# Patient Record
Sex: Female | Born: 1960 | Race: White | Hispanic: No | Marital: Single | Smoking: Never smoker
Health system: Southern US, Community
[De-identification: ages and names within clinical notes are randomized; demographics above are authoritative.]

## PROBLEM LIST (undated history)

## (undated) DIAGNOSIS — Z800 Family history of malignant neoplasm of digestive organs: Secondary | ICD-10-CM

## (undated) DIAGNOSIS — M1990 Unspecified osteoarthritis, unspecified site: Secondary | ICD-10-CM

## (undated) DIAGNOSIS — T7840XA Allergy, unspecified, initial encounter: Secondary | ICD-10-CM

## (undated) DIAGNOSIS — Z8042 Family history of malignant neoplasm of prostate: Secondary | ICD-10-CM

## (undated) DIAGNOSIS — K5900 Constipation, unspecified: Secondary | ICD-10-CM

## (undated) DIAGNOSIS — Z803 Family history of malignant neoplasm of breast: Secondary | ICD-10-CM

## (undated) HISTORY — DX: Family history of malignant neoplasm of digestive organs: Z80.0

## (undated) HISTORY — DX: Family history of malignant neoplasm of prostate: Z80.42

## (undated) HISTORY — PX: COLONOSCOPY: SHX174

## (undated) HISTORY — DX: Unspecified osteoarthritis, unspecified site: M19.90

## (undated) HISTORY — DX: Constipation, unspecified: K59.00

## (undated) HISTORY — DX: Allergy, unspecified, initial encounter: T78.40XA

## (undated) HISTORY — PX: TOTAL HIP ARTHROPLASTY: SHX124

## (undated) HISTORY — PX: POLYPECTOMY: SHX149

## (undated) HISTORY — PX: BILATERAL ANTERIOR TOTAL HIP ARTHROPLASTY: SHX5567

## (undated) HISTORY — PX: JOINT REPLACEMENT: SHX530

## (undated) HISTORY — DX: Family history of malignant neoplasm of breast: Z80.3

## (undated) HISTORY — PX: OTHER SURGICAL HISTORY: SHX169

---

## 1998-03-16 ENCOUNTER — Inpatient Hospital Stay (HOSPITAL_COMMUNITY): Admission: AD | Admit: 1998-03-16 | Discharge: 1998-03-18 | Payer: Self-pay | Admitting: Obstetrics and Gynecology

## 1998-03-20 ENCOUNTER — Encounter (HOSPITAL_COMMUNITY): Admission: RE | Admit: 1998-03-20 | Discharge: 1998-06-18 | Payer: Self-pay | Admitting: Obstetrics and Gynecology

## 1998-05-12 ENCOUNTER — Other Ambulatory Visit: Admission: RE | Admit: 1998-05-12 | Discharge: 1998-05-12 | Payer: Self-pay | Admitting: Obstetrics and Gynecology

## 1999-05-13 ENCOUNTER — Other Ambulatory Visit: Admission: RE | Admit: 1999-05-13 | Discharge: 1999-05-13 | Payer: Self-pay | Admitting: Obstetrics and Gynecology

## 2004-09-17 ENCOUNTER — Ambulatory Visit: Payer: Self-pay | Admitting: Internal Medicine

## 2004-09-23 ENCOUNTER — Ambulatory Visit: Payer: Self-pay | Admitting: Family Medicine

## 2004-09-23 ENCOUNTER — Other Ambulatory Visit: Admission: RE | Admit: 2004-09-23 | Discharge: 2004-09-23 | Payer: Self-pay | Admitting: Family Medicine

## 2005-01-18 ENCOUNTER — Ambulatory Visit: Payer: Self-pay | Admitting: Family Medicine

## 2005-07-28 ENCOUNTER — Ambulatory Visit: Payer: Self-pay | Admitting: Internal Medicine

## 2005-09-29 ENCOUNTER — Ambulatory Visit: Payer: Self-pay | Admitting: Internal Medicine

## 2005-10-06 ENCOUNTER — Encounter: Payer: Self-pay | Admitting: Family Medicine

## 2005-10-06 ENCOUNTER — Ambulatory Visit: Payer: Self-pay | Admitting: Family Medicine

## 2005-10-06 ENCOUNTER — Other Ambulatory Visit: Admission: RE | Admit: 2005-10-06 | Discharge: 2005-10-06 | Payer: Self-pay | Admitting: Family Medicine

## 2005-12-01 ENCOUNTER — Ambulatory Visit: Payer: Self-pay | Admitting: Internal Medicine

## 2005-12-02 ENCOUNTER — Ambulatory Visit: Payer: Self-pay | Admitting: Internal Medicine

## 2006-10-31 ENCOUNTER — Encounter: Payer: Self-pay | Admitting: Family Medicine

## 2006-10-31 ENCOUNTER — Ambulatory Visit: Payer: Self-pay | Admitting: Family Medicine

## 2006-10-31 DIAGNOSIS — J309 Allergic rhinitis, unspecified: Secondary | ICD-10-CM | POA: Insufficient documentation

## 2006-10-31 LAB — CONVERTED CEMR LAB
Blood in Urine, dipstick: NEGATIVE
Glucose, Urine, Semiquant: NEGATIVE
Ketones, urine, test strip: NEGATIVE
Nitrite: POSITIVE
Protein, U semiquant: NEGATIVE
pH: 6

## 2006-11-01 ENCOUNTER — Encounter: Payer: Self-pay | Admitting: Family Medicine

## 2007-09-21 ENCOUNTER — Ambulatory Visit: Payer: Self-pay | Admitting: Internal Medicine

## 2007-11-16 ENCOUNTER — Encounter: Payer: Self-pay | Admitting: Internal Medicine

## 2008-04-11 ENCOUNTER — Encounter (INDEPENDENT_AMBULATORY_CARE_PROVIDER_SITE_OTHER): Payer: Self-pay | Admitting: *Deleted

## 2008-04-11 ENCOUNTER — Ambulatory Visit: Payer: Self-pay | Admitting: Family Medicine

## 2008-04-11 DIAGNOSIS — R141 Gas pain: Secondary | ICD-10-CM | POA: Insufficient documentation

## 2008-04-11 DIAGNOSIS — R142 Eructation: Secondary | ICD-10-CM

## 2008-04-11 DIAGNOSIS — R143 Flatulence: Secondary | ICD-10-CM

## 2008-04-11 LAB — CONVERTED CEMR LAB
Bilirubin Urine: NEGATIVE
Glucose, Urine, Semiquant: NEGATIVE
Ketones, urine, test strip: NEGATIVE
Protein, U semiquant: NEGATIVE
Specific Gravity, Urine: <1.005
Urobilinogen, UA: 0.2

## 2008-04-12 ENCOUNTER — Encounter: Payer: Self-pay | Admitting: Family Medicine

## 2008-04-17 ENCOUNTER — Encounter (INDEPENDENT_AMBULATORY_CARE_PROVIDER_SITE_OTHER): Payer: Self-pay | Admitting: *Deleted

## 2008-04-18 ENCOUNTER — Encounter: Admission: RE | Admit: 2008-04-18 | Discharge: 2008-04-18 | Payer: Self-pay | Admitting: Family Medicine

## 2008-04-18 ENCOUNTER — Encounter (INDEPENDENT_AMBULATORY_CARE_PROVIDER_SITE_OTHER): Payer: Self-pay | Admitting: *Deleted

## 2008-04-18 DIAGNOSIS — D259 Leiomyoma of uterus, unspecified: Secondary | ICD-10-CM | POA: Insufficient documentation

## 2008-04-22 ENCOUNTER — Telehealth (INDEPENDENT_AMBULATORY_CARE_PROVIDER_SITE_OTHER): Payer: Self-pay | Admitting: *Deleted

## 2008-04-23 ENCOUNTER — Encounter (INDEPENDENT_AMBULATORY_CARE_PROVIDER_SITE_OTHER): Payer: Self-pay | Admitting: *Deleted

## 2008-04-30 ENCOUNTER — Encounter: Payer: Self-pay | Admitting: Family Medicine

## 2008-06-11 ENCOUNTER — Ambulatory Visit: Payer: Self-pay | Admitting: Family Medicine

## 2008-10-01 ENCOUNTER — Ambulatory Visit: Payer: Self-pay | Admitting: Internal Medicine

## 2008-11-17 ENCOUNTER — Ambulatory Visit: Payer: Self-pay | Admitting: Family Medicine

## 2008-11-17 DIAGNOSIS — M549 Dorsalgia, unspecified: Secondary | ICD-10-CM | POA: Insufficient documentation

## 2009-03-20 ENCOUNTER — Ambulatory Visit: Payer: Self-pay | Admitting: Internal Medicine

## 2009-03-20 DIAGNOSIS — N920 Excessive and frequent menstruation with regular cycle: Secondary | ICD-10-CM | POA: Insufficient documentation

## 2009-03-20 DIAGNOSIS — R1084 Generalized abdominal pain: Secondary | ICD-10-CM | POA: Insufficient documentation

## 2009-03-21 LAB — CONVERTED CEMR LAB
Amylase: 92 units/L (ref 27–131)
Basophils Relative: 0 % (ref 0.0–3.0)
Eosinophils Relative: 0.9 % (ref 0.0–5.0)
HCT: 43.2 % (ref 36.0–46.0)
Lipase: 19 units/L (ref 11.0–59.0)
Lymphs Abs: 2.7 10*3/uL (ref 0.7–4.0)
MCV: 93.1 fL (ref 78.0–100.0)
Monocytes Absolute: 0.5 10*3/uL (ref 0.1–1.0)
Monocytes Relative: 7.2 % (ref 3.0–12.0)
Neutrophils Relative %: 52.8 % (ref 43.0–77.0)
Platelets: 241 10*3/uL (ref 150.0–400.0)
RBC: 4.64 M/uL (ref 3.87–5.11)
WBC: 6.8 10*3/uL (ref 4.5–10.5)

## 2009-03-23 ENCOUNTER — Encounter (INDEPENDENT_AMBULATORY_CARE_PROVIDER_SITE_OTHER): Payer: Self-pay | Admitting: *Deleted

## 2009-03-26 ENCOUNTER — Telehealth (INDEPENDENT_AMBULATORY_CARE_PROVIDER_SITE_OTHER): Payer: Self-pay | Admitting: *Deleted

## 2009-04-28 ENCOUNTER — Ambulatory Visit: Payer: Self-pay | Admitting: Internal Medicine

## 2009-04-28 DIAGNOSIS — J019 Acute sinusitis, unspecified: Secondary | ICD-10-CM | POA: Insufficient documentation

## 2009-06-18 ENCOUNTER — Ambulatory Visit: Payer: Self-pay | Admitting: Family Medicine

## 2009-06-18 DIAGNOSIS — M25519 Pain in unspecified shoulder: Secondary | ICD-10-CM | POA: Insufficient documentation

## 2009-06-18 DIAGNOSIS — M79609 Pain in unspecified limb: Secondary | ICD-10-CM | POA: Insufficient documentation

## 2009-06-19 ENCOUNTER — Encounter: Payer: Self-pay | Admitting: Family Medicine

## 2009-06-19 LAB — CONVERTED CEMR LAB
Chloride: 102 meq/L (ref 96–112)
GFR calc non Af Amer: 70.99 mL/min (ref >60)
Potassium: 4.1 meq/L (ref 3.5–5.1)
Sodium: 141 meq/L (ref 135–145)

## 2009-08-12 ENCOUNTER — Telehealth (INDEPENDENT_AMBULATORY_CARE_PROVIDER_SITE_OTHER): Payer: Self-pay | Admitting: *Deleted

## 2009-08-21 ENCOUNTER — Ambulatory Visit: Payer: Self-pay | Admitting: Family Medicine

## 2009-08-21 ENCOUNTER — Telehealth (INDEPENDENT_AMBULATORY_CARE_PROVIDER_SITE_OTHER): Payer: Self-pay | Admitting: *Deleted

## 2009-09-07 ENCOUNTER — Telehealth (INDEPENDENT_AMBULATORY_CARE_PROVIDER_SITE_OTHER): Payer: Self-pay | Admitting: *Deleted

## 2009-09-08 ENCOUNTER — Ambulatory Visit: Payer: Self-pay | Admitting: Family Medicine

## 2009-09-08 DIAGNOSIS — J069 Acute upper respiratory infection, unspecified: Secondary | ICD-10-CM | POA: Insufficient documentation

## 2009-09-08 LAB — CONVERTED CEMR LAB: Inflenza A Ag: NEGATIVE

## 2009-09-10 ENCOUNTER — Encounter (INDEPENDENT_AMBULATORY_CARE_PROVIDER_SITE_OTHER): Payer: Self-pay | Admitting: *Deleted

## 2009-09-10 ENCOUNTER — Telehealth: Payer: Self-pay | Admitting: Family Medicine

## 2009-09-14 ENCOUNTER — Telehealth: Payer: Self-pay | Admitting: Family Medicine

## 2009-09-16 ENCOUNTER — Ambulatory Visit: Payer: Self-pay | Admitting: Family Medicine

## 2009-09-16 LAB — CONVERTED CEMR LAB
Bilirubin Urine: NEGATIVE
Blood in Urine, dipstick: NEGATIVE
Glucose, Urine, Semiquant: NEGATIVE
Ketones, urine, test strip: NEGATIVE
Specific Gravity, Urine: <1.005
pH: 5

## 2010-06-01 ENCOUNTER — Encounter: Payer: Self-pay | Admitting: Family Medicine

## 2010-06-30 ENCOUNTER — Ambulatory Visit: Payer: Self-pay | Admitting: Family

## 2010-06-30 DIAGNOSIS — N760 Acute vaginitis: Secondary | ICD-10-CM | POA: Insufficient documentation

## 2010-06-30 LAB — CONVERTED CEMR LAB
Beta hcg, urine, semiquantitative: NEGATIVE
Bilirubin Urine: NEGATIVE
Glucose, Urine, Semiquant: NEGATIVE
Ketones, urine, test strip: NEGATIVE
Specific Gravity, Urine: <1.005
Urobilinogen, UA: 0.2
pH: 7

## 2010-07-01 ENCOUNTER — Encounter: Payer: Self-pay | Admitting: Family Medicine

## 2010-07-02 ENCOUNTER — Encounter: Payer: Self-pay | Admitting: Family Medicine

## 2010-07-06 ENCOUNTER — Encounter: Payer: Self-pay | Admitting: Family Medicine

## 2010-07-06 ENCOUNTER — Telehealth (INDEPENDENT_AMBULATORY_CARE_PROVIDER_SITE_OTHER): Payer: Self-pay | Admitting: *Deleted

## 2010-07-06 LAB — CONVERTED CEMR LAB: Chlamydia, DNA Probe: NEGATIVE

## 2010-07-08 ENCOUNTER — Telehealth (INDEPENDENT_AMBULATORY_CARE_PROVIDER_SITE_OTHER): Payer: Self-pay | Admitting: *Deleted

## 2010-07-21 ENCOUNTER — Encounter: Payer: Self-pay | Admitting: Family Medicine

## 2010-08-03 ENCOUNTER — Encounter: Payer: Self-pay | Admitting: Internal Medicine

## 2010-08-23 ENCOUNTER — Encounter: Payer: Self-pay | Admitting: Family Medicine

## 2010-09-02 NOTE — Progress Notes (Signed)
Summary: ? flu  Phone Note Call from Patient Call back at Home Phone [phone]   Caller: Patient Summary of Call: pt states that she think that she has the flu. pt daughter dx with flu and they are both at home. pt states that she will need a work note. pt states that she has had a fever of 102 and her daughter 66. pt states that she has been alternating motrin and tylenol every 4 hours and wants to know if there is anything else she needs to do. Advise pt to push fluid, increase vitamin c, and zicam. pt coming in for OV tomorrow is there anything else she needs to try pls advise...........................Marland KitchenFelecia Deloach CMA  September 07, 2009 3:06 PM     Follow-up for Phone Call        When did pt symptoms start---if within 48 hours---can take tamiflu 75 mg two times a day for 5 days  Follow-up by: Loreen Freud DO,  September 07, 2009 3:51 PM  Additional Follow-up for Phone Call Additional follow up Details #1::        PT COMING IN TODAY for OV ................Marland KitchenFelecia Deloach CMA  September 08, 2009 9:15 AM

## 2010-09-02 NOTE — Progress Notes (Signed)
Summary: Work note? (lmom 1/12)  Phone Note Call from Patient Call back at [facility] Phone [phone]   Summary of Call: Pt called and stated that she has very painful cramps today, a headache, heavy flow. She states in order to get paid for today she has to have a dr note for our office. Is this something that you will do. She knows normally it does not work if she has not seen you the day of. Please advise. Army Fossa CMA  August 12, 2009 11:56 AM  Follow-up for Phone Call        I can't write a note unless I've seen her. Follow-up by: Loreen Freud DO,  August 12, 2009 1:14 PM  Additional Follow-up for Phone Call Additional follow up Details #1::        left message to call back Army Fossa CMA  August 12, 2009 1:33 PM

## 2010-09-02 NOTE — Progress Notes (Signed)
Summary: still no better  Phone Note Call from Patient Call back at Work Phone [phone]   Caller: Patient Summary of Call: pt seen on  09-08-09 and  is still no better. Pt c/o fatigue, aches, drainage in throat, ear pressure, and green mucous. pt  denies any fever................Marland KitchenFelecia Deloach CMA  September 14, 2009 11:58 AM   Follow-up for Phone Call        augmentin 875 1 by mouth two times a day for 10 days ---ov if no better  Follow-up by: Loreen Freud DO,  September 14, 2009 12:10 PM  Additional Follow-up for Phone Call Additional follow up Details #1::        pt aware rx sent to pharmacy............Marland KitchenFelecia Deloach CMA  September 14, 2009 1:16 PM     New/Updated Medications: AUGMENTIN 875-125 MG TABS (AMOXICILLIN-POT CLAVULANATE) take 1 by mouth two times a day for 10 days Prescriptions: AUGMENTIN 875-125 MG TABS (AMOXICILLIN-POT CLAVULANATE) take 1 by mouth two times a day for 10 days  #20 x 0   Entered by:   Jeremy Johann CMA   Authorized by:   Loreen Freud DO   Signed by:   Jeremy Johann CMA on 09/14/2009   Method used:   Faxed to ...       [facility] [address] [phone]* (retail)       [address]       [place], [place]  [address]       Ph: [phone]       Fax: [phone]   RxID:   [id number]

## 2010-09-02 NOTE — Progress Notes (Signed)
Summary: Unable to contact patient  Phone Note Outgoing Call   Call placed by: Almeta Monas CMA Duncan Dull),  July 06, 2010 10:45 AM Call placed to: Patient Summary of Call: Tried calling the patient at [phone] and this number is disconnected and tried [phone] and this is a voicemail for someone else. Letter mailed to have patient contact the office..... Almeta Monas CMA Duncan Dull)  July 06, 2010 10:47 AM

## 2010-09-02 NOTE — Assessment & Plan Note (Signed)
Summary: sinus infection/drb   Vital Signs:  Patient profile:   50 year old female Weight:      165.13 pounds Temp:     97.9 degrees F oral Pulse rate:   82 / minute Pulse rhythm:   regular BP sitting:   122 / 80  (left arm) Cuff size:   large  Vitals Entered By: Army Fossa CMA (August 21, 2009 9:56 AM) CC: URI symptoms   History of Present Illness:       This is a 50 year old woman who presents with URI symptoms.  The symptoms began 1 week ago.  The patient complains of nasal congestion, purulent nasal discharge, productive cough, and earache.  The patient denies fever, low-grade fever (<100.5 degrees), fever of 100.5-103 degrees, fever of 103.1-104 degrees, fever to >104 degrees, stiff neck, dyspnea, wheezing, rash, vomiting, diarrhea, use of an antipyretic, and response to antipyretic.  The patient also reports headache.  The patient denies the following risk factors for Strep sinusitis: unilateral facial pain, unilateral nasal discharge, poor response to decongestant, double sickening, tooth pain, Strep exposure, tender adenopathy, and absence of cough.  Pt using neti pot with little relief.    Current Medications (verified): 1)  Nasonex 50 Mcg/act Susp (Mometasone Furoate) .... 2 Sprays Each Nostril Daily  Allergies (verified): No Known Drug Allergies  Past History:  Past Medical History: Last updated: 10/31/2006 Allergic rhinitis  Social History: Last updated: 06/18/2009 Married-- separated  Review of Systems      See HPI  Physical Exam  General:  Well-developed,well-nourished,in no acute distress; alert,appropriate and cooperative throughout examination Ears:  External ear exam shows no significant lesions or deformities.  Otoscopic examination reveals clear canals, tympanic membranes are intact bilaterally without bulging, retraction, inflammation or discharge. Hearing is grossly normal bilaterally. Nose:  L frontal sinus tenderness, L maxillary sinus  tenderness, R frontal sinus tenderness, and R maxillary sinus tenderness.   Mouth:  Oral mucosa and oropharynx without lesions or exudates.  Teeth in good repair. Neck:  No deformities, masses, or tenderness noted. Lungs:  Normal respiratory effort, chest expands symmetrically. Lungs are clear to auscultation, no crackles or wheezes. Heart:  Normal rate and regular rhythm. S1 and S2 normal without gallop, murmur, click, rub or other extra sounds. Skin:  Intact without suspicious lesions or rashes Psych:  Cognition and judgment appear intact. Alert and cooperative with normal attention span and concentration. No apparent delusions, illusions, hallucinations   Impression & Recommendations:  Problem # 1:  SINUSITIS - ACUTE-NOS (ICD-461.9)  Her updated medication list for this problem includes:    Nasonex 50 Mcg/act Susp (Mometasone furoate) .Marland Kitchen... 2 sprays each nostril daily    Biaxin Xl Pac 500 Mg Xr24h-tab (Clarithromycin) .Marland Kitchen... 2 by mouth once daily  Instructed on treatment. Call if symptoms persist or worsen.   Complete Medication List: 1)  Nasonex 50 Mcg/act Susp (Mometasone furoate) .... 2 sprays each nostril daily 2)  Biaxin Xl Pac 500 Mg Xr24h-tab (Clarithromycin) .... 2 by mouth once daily 3)  Fluconazole 150 Mg Tabs (Fluconazole) .Marland Kitchen.. 1 by mouth once daily x1--may repeat in 3 days as needed Prescriptions: FLUCONAZOLE 150 MG TABS (FLUCONAZOLE) 1 by mouth once daily x1--may repeat in 3 days as needed  #2 x 3   Entered and Authorized by:   Loreen Freud DO   Signed by:   Loreen Freud DO on 08/21/2009   Method used:   Electronically to        [facility] [address] [phone]* (retail)       [address]       [place], [place]  [address]       Ph: [phone]       Fax: [phone]   RxID:   [id number] NASONEX 50 MCG/ACT SUSP (MOMETASONE FUROATE) 2 sprays each nostril daily  #1 x 11   Entered and Authorized by:   Loreen Freud DO   Signed by:   Loreen Freud DO on 08/21/2009   Method used:   Electronically to        [facility] [phone]* (retail)       [address]       [place], [place]  [address]       Ph: [phone]       Fax: [phone]   RxID:   [id number] BIAXIN XL PAC 500 MG XR24H-TAB (CLARITHROMYCIN) 2 by mouth once daily  #28 x 0   Entered and Authorized by:   Loreen Freud DO   Signed by:   Loreen Freud DO on 08/21/2009   Method used:   Electronically to        [facility] [phone]* (retail)       [address]       [place], [place]  [address]       Ph: [phone]       Fax: [phone]   RxID:   [id number]

## 2010-09-02 NOTE — Progress Notes (Signed)
Summary: Phone  Phone Note Call from Patient Call back at Home Phone [phone]   Caller: Patient Summary of Call: Patient is at [facility] [address] and she is waiting. Patient states they dont have  biaxin in the package  that Dr. Laury Axon prescribed.  Please advise Initial call taken by: Barb Merino,  August 21, 2009 11:35 AM  Follow-up for Phone Call        Per dr Laury Axon it is okay to do the pills. Army Fossa CMA  August 21, 2009 11:47 AM

## 2010-09-02 NOTE — Letter (Signed)
Summary: Meds & Instructions/[facility]  Meds & Instructions/[facility]   Imported By: Lanelle Bal 08/05/2010 08:06:37  _____________________________________________________________________  External Attachment:    Type:   Image     Comment:   External Document

## 2010-09-02 NOTE — Assessment & Plan Note (Signed)
Summary: ? flu,fever/fd   Vital Signs:  Patient profile:   50 year old female Temp:     98.3 degrees F oral Pulse rate:   64 / minute Pulse rhythm:   regular BP sitting:   150 / 78  (left arm) Cuff size:   large  Vitals Entered By: Army Fossa CMA (September 08, 2009 2:14 PM) CC: Chills, cough, nasal congestion, body aches, ear aches. , URI symptoms   History of Present Illness:       This is a 50 year old woman who presents with URI symptoms.  The symptoms began 4 days ago.  Pt has been taking tylenol and ibuprofen for fever etc.  The patient complains of nasal congestion, purulent nasal discharge, and sore throat, but denies dry cough and productive cough.  Associated symptoms include fever of 100.5-103 degrees.  The patient denies fever, low-grade fever (<100.5 degrees), fever of 103.1-104 degrees, fever to >104 degrees, stiff neck, dyspnea, wheezing, rash, vomiting, diarrhea, use of an antipyretic, and response to antipyretic.  The patient denies itchy watery eyes, itchy throat, sneezing, seasonal symptoms, response to antihistamine, headache, muscle aches, and severe fatigue.  The patient denies the following risk factors for Strep sinusitis: unilateral facial pain, unilateral nasal discharge, poor response to decongestant, double sickening, tooth pain, Strep exposure, tender adenopathy, and absence of cough.    Current Medications (verified): 1)  Nasonex 50 Mcg/act Susp (Mometasone Furoate) .... 2 Sprays Each Nostril Daily 2)  Fluconazole 150 Mg Tabs (Fluconazole) .Marland Kitchen.. 1 By Mouth Once Daily X1--May Repeat in 3 Days As Needed 3)  Tamiflu 75 Mg Caps (Oseltamivir Phosphate) .Marland Kitchen.. 1 By Mouth Two Times A Day  Allergies (verified): No Known Drug Allergies  Past History:  Past medical, surgical, family and social histories (including risk factors) reviewed for relevance to current acute and chronic problems.  Past Medical History: Reviewed history from 10/31/2006 and no changes  required. Allergic rhinitis  Family History: Reviewed history and no changes required.  Social History: Reviewed history from 06/18/2009 and no changes required. Married-- separated  Review of Systems      See HPI  Physical Exam  General:  Well-developed,well-nourished,in no acute distress; alert,appropriate and cooperative throughout examination Ears:  External ear exam shows no significant lesions or deformities.  Otoscopic examination reveals clear canals, tympanic membranes are intact bilaterally without bulging, retraction, inflammation or discharge. Hearing is grossly normal bilaterally. Neck:  No deformities, masses, or tenderness noted. Lungs:  Normal respiratory effort, chest expands symmetrically. Lungs are clear to auscultation, no crackles or wheezes. Heart:  Normal rate and regular rhythm. S1 and S2 normal without gallop, murmur, click, rub or other extra sounds. Extremities:  No clubbing, cyanosis, edema, or deformity noted with normal full range of motion of all joints.   Skin:  Intact without suspicious lesions or rashes Cervical Nodes:  No lymphadenopathy noted Psych:  Cognition and judgment appear intact. Alert and cooperative with normal attention span and concentration. No apparent delusions, illusions, hallucinations   Impression & Recommendations:  Problem # 1:  URI (ICD-465.9)  ?flu----tamiflu con't nasonex claritin or zyrtec  Orders: Flu A+B (956[address])  Complete Medication List: 1)  Nasonex 50 Mcg/act Susp (Mometasone furoate) .... 2 sprays each nostril daily 2)  Fluconazole 150 Mg Tabs (Fluconazole) .Marland Kitchen.. 1 by mouth once daily x1--may repeat in 3 days as needed 3)  Tamiflu 75 Mg Caps (Oseltamivir phosphate) .Marland Kitchen.. 1 by mouth two times a day Prescriptions: TAMIFLU 75 MG CAPS (OSELTAMIVIR PHOSPHATE)  1 by mouth two times a day  #10 x 0   Entered and Authorized by:   Loreen Freud DO   Signed by:   Loreen Freud DO on 09/08/2009   Method used:    Electronically to        [facility] [phone]* (retail)       [address]       [place], [place]  [address]       Ph: [phone]       Fax: [phone]   RxID:   [id number]   Laboratory Results    Other Tests  Influenza A: negative Comments: Army Fossa CMA  September 08, 2009 2:47 PM

## 2010-09-02 NOTE — Progress Notes (Signed)
Summary: Results  Phone Note Call from Patient   Caller: Patient Call For: Loreen Freud DO Details for Reason: Results Summary of Call: Pt rcv'd letter and wanted to get results....  I advised all labs neg. she voiced understanding. Call ended Initial call taken by: Almeta Monas CMA Duncan Dull),  July 08, 2010 4:52 PM

## 2010-09-02 NOTE — Assessment & Plan Note (Signed)
Summary: vaginal discharge, itching, and redness//fd--Rm 5   Vital Signs:  Patient profile:   50 year old female Height:      71 inches Weight:      157.75 pounds BMI:     22.08 Temp:     98.4 degrees F oral Pulse rate:   84 / minute Pulse rhythm:   regular Resp:     16 per minute BP sitting:   120 / 80  (right arm) Cuff size:   regular  Vitals Entered By: Mervin Kung CMA Duncan Dull) (June 30, 2010 2:23 PM) CC: Pt states she has vaginal itching and white discharge since this weekend. Is Patient Diabetic? No Pain Assessment Patient in pain? no        Primary Care Tonny Isensee:  lowne  CC:  Pt states she has vaginal itching and white discharge since this weekend..  History of Present Illness: Ann Maddox is a 50 year old female who presents with 1 week history of white vaginal discharge which is associtated with  + vaginal itching.  Notes one sexual partner x 2 months.    Did not get period since end of September.  12 pound weight loss which has been intentional.  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    Review of Systems       denies, nausea, vomitting or abdominal pain.   Physical Exam  General:  Sitting on exam table, awake, alert- working on her lap top, "I am ordering a pair of shoes while I am talking to you." Neck:  No deformities, masses, or tenderness noted. Genitalia:  Pelvic Exam:        External: normal female genitalia without lesions or masses        Vagina: normal without lesions or masses, white mucousy discharge        Cervix: normal  without lesions or masses        Adnexa: normal bimanual exam without masses or fullness        Uterus: normal by palpation        Pap smear: not performed   Impression & Recommendations:  Problem # 1:  VAGINITIS (ICD-616.10) Assessment Comment Only Wet prep sent to lab.  Will plan to treat empirically with fluconazole and await results. GC/ Chlamydia also sent today.    Problem # 2:  AMENORRHEA (ICD-626.0) Assessment: New Likely perimenopausal.  Urine HCG is negative today. Orders: Urine Pregnancy Test  ([address])  Complete Medication List: 1)  Nasonex 50 Mcg/act Susp (Mometasone furoate) .... 2 sprays each nostril daily 2)  Fluconazole 150 Mg Tabs (Fluconazole) .Marland Kitchen.. 1 by mouth once daily x1--may repeat in 3 days as needed 3)  Zyrtec Allergy 10 Mg Tabs (Cetirizine hcl) .... Once daily as needed.  Other Orders: T-Wet Prep by Molecular Probe [phone]) T-Chlamydia & GC Probe, Genital ([id number]) Specimen Handling ([address]) UA Dipstick w/o Micro (automated)  (81003)  Patient Instructions: 1)  Please call if your symptoms worsen, or if they do not improve. Prescriptions: FLUCONAZOLE 150 MG TABS (FLUCONAZOLE) 1 by mouth once daily x1--may repeat in 3 days as needed  #2 x 0   Entered  and Authorized by:   Lemont Fillers FNP   Signed by:   Lemont Fillers FNP on 06/30/2010   Method used:   Electronically to        [facility] [phone]* (retail)       [address]       [place], [place]  [address]       Ph: [phone]       Fax: [phone]   RxID:   [id number]    Orders Added: 1)  T-Wet Prep by Molecular Probe [phone] 2)  T-Chlamydia & GC Probe, Genital [[id number]] 3)  Specimen Handling [99000] 4)  UA Dipstick w/o Micro (automated)  [81003] 5)  Urine Pregnancy Test  [81025] 6)  Est. Patient Level III [78938]    Current Allergies (reviewed today): No known allergies    Laboratory Results     Urine Tests   Date/Time Reported: Mervin Kung CMA Duncan Dull)  June 30, 2010 3:16 PM   Routine Urinalysis   Color: straw Appearance: Clear Glucose: negative   (Normal Range: Negative) Bilirubin: negative   (Normal Range: Negative) Ketone: negative   (Normal Range: Negative) Spec. Gravity: <1.005   (Normal Range: 1.003-1.035) Blood: trace-intact   (Normal Range: Negative) pH: 7.0   (Normal Range: 5.0-8.0) Protein: negative   (Normal Range: Negative) Urobilinogen: 0.2   (Normal Range: 0-1) Nitrite: negative   (Normal Range: Negative) Leukocyte Esterace: negative   (Normal Range: Negative)    Urine HCG: negative

## 2010-09-02 NOTE — Letter (Signed)
Summary: Primary Care Appointment Letter  [facility]  [address] [place], [place] [address]   Phone: [phone]  Fax: [phone]    07/02/2010 MRN: [id number]   Ann Maddox [address] [place], [place]  [address]    Dear [name],   Your Primary Care Physician Loreen Freud DO has indicated that:    _______it is time to schedule an appointment.    _______you missed your appointment on______ and need to call and          reschedule.    _______you need to have lab work done.    _______you need to schedule an appointment discuss lab or test results.    _______you need to call to reschedule your appointment that is                       scheduled on _________.   ___x____Please call the office in reference to your previous office visit and update your contact information.    Please call our office as soon as possible. Our phone number is [phone]. Please press option 1. Our office is open 8a-5p, Monday through Friday.    Thank you, [facility] Scheduler

## 2010-09-02 NOTE — Letter (Signed)
Summary: Out of Work  [facility]  [address] [place], [place] [address]   Phone: [phone]  Fax: [phone]    September 10, 2009   Employee:  VIOLA KINNICK [facility] [facility]    To Whom It May Concern:   For Medical reasons, please excuse the above named employee from work for the following dates:  Start:   September 07, 2008  End:   September 09, 2008  If you need additional information, please feel free to contact our office.         Sincerely,    Loreen Freud, DO

## 2010-09-02 NOTE — Letter (Signed)
Summary: [facility]  [facility]   Imported By: Lanelle Bal 08/13/2010 14:07:15  _____________________________________________________________________  External Attachment:    Type:   Image     Comment:   External Document

## 2010-09-02 NOTE — Progress Notes (Signed)
Summary: Phone-not for work  Phone Note Call from Patient Call back at Work Phone [phone]   Caller: Patient Summary of Call: Patient is requesting a not from Feb 7 to Feb 9,2011. Patient states she has not had a fever since tuesday night and no meds. Patient needs this not within 45 mins. Please fax Fleet Contras [phone] Initial call taken by: Barb Merino,  September 10, 2009 10:41 AM  Follow-up for Phone Call        Im sure she means note---- ok to change it Follow-up by: Loreen Freud DO,  September 10, 2009 10:54 AM  Additional Follow-up for Phone Call Additional follow up Details #1::        left message at work number and faxed work note. Army Fossa CMA  September 10, 2009 11:03 AM

## 2010-09-02 NOTE — Letter (Signed)
Summary: Out of Work  [facility]  [address] [place], [place] [address]   Phone: [phone]  Fax: [phone]    August 21, 2009   Employee:  Ann Maddox [employer] [facility]    To Whom It May Concern:   For Medical reasons, please excuse the above named employee from work for the following dates:  Start:   08/21/2009  End:   08/24/2009  If you need additional information, please feel free to contact our office.         Sincerely,    Loreen Freud DO

## 2010-09-02 NOTE — Letter (Signed)
Summary: Out of School  [facility]  [address] [place], [place] [address]   Phone: [phone]  Fax: [phone]    September 08, 2009   Student:  Ann Maddox [employer]    To Whom It May Concern:   For Medical reasons, please excuse the above named student from school for the following dates:  Start:   September 08, 2009  End:    September 11, 2009  If you need additional information, please feel free to contact our office.   Sincerely,    Loreen Freud DO    ****This is a legal document and cannot be tampered with.  Schools are authorized to verify all information and to do so accordingly.

## 2010-09-02 NOTE — Assessment & Plan Note (Signed)
Summary: UTI?/KDC OK PER NIKKI   Vital Signs:  Patient profile:   50 year old female Temp:     99.3 degrees F oral  Vitals Entered By: Sid Falcon LPN (September 16, 2009 4:44 PM) CC: UTI symptoms   History of Present Illness: Patient seen as a work in. Concern for possible UTI.  Several weeks of upper respiratory sinusitis symptoms. Initially treated with Biaxin and recently started on Augmentin. Sinus symptoms are somewhat better. One-day history of suprapubic pressure but no burning with urination. Mild vaginal itching and started Diflucan yesterday. No vaginal discharge. Denies burning with urination. Suprapubic pain is moderate in intensity and achy quality. No diarrhea  Allergies: No Known Drug Allergies  Past History:  Past Medical History: Last updated: 10/31/2006 Allergic rhinitis PMH reviewed for relevance  Review of Systems  The patient denies anorexia, fever, weight loss, melena, hematochezia, severe indigestion/heartburn, hematuria, and incontinence.    Physical Exam  General:  Well-developed,well-nourished,in no acute distress; alert,appropriate and cooperative throughout examination Ears:  External ear exam shows no significant lesions or deformities.  Otoscopic examination reveals clear canals, tympanic membranes are intact bilaterally without bulging, retraction, inflammation or discharge. Hearing is grossly normal bilaterally. Nose:  External nasal examination shows no deformity or inflammation. Nasal mucosa are pink and moist without lesions or exudates. Mouth:  Oral mucosa and oropharynx without lesions or exudates.  Teeth in good repair. Neck:  No deformities, masses, or tenderness noted. Lungs:  Normal respiratory effort, chest expands symmetrically. Lungs are clear to auscultation, no crackles or wheezes. Heart:  Normal rate and regular rhythm. S1 and S2 normal without gallop, murmur, click, rub or other extra sounds.   Impression &  Recommendations:  Problem # 1:  ABDOMINAL PAIN, SUPRAPUBIC (ICD-789.09)  Urine dipstick is entirely clear. No evidence for UTI. Question antibiotic related though no diarrhea. Follow for now  Orders: UA Dipstick w/o Micro (manual) (65784)  Complete Medication List: 1)  Nasonex 50 Mcg/act Susp (Mometasone furoate) .... 2 sprays each nostril daily 2)  Fluconazole 150 Mg Tabs (Fluconazole) .Marland Kitchen.. 1 by mouth once daily x1--may repeat in 3 days as needed 3)  Augmentin 875-125 Mg Tabs (Amoxicillin-pot clavulanate) .... Take 1 by mouth two times a day for 10 days  Laboratory Results   Urine Tests    Routine Urinalysis   Color: yellow Appearance: Clear Glucose: negative   (Normal Range: Negative) Bilirubin: negative   (Normal Range: Negative) Ketone: negative   (Normal Range: Negative) Spec. Gravity: <1.005   (Normal Range: 1.003-1.035) Blood: negative   (Normal Range: Negative) pH: 5.0   (Normal Range: 5.0-8.0) Protein: negative   (Normal Range: Negative) Urobilinogen: 0.2   (Normal Range: 0-1) Nitrite: negative   (Normal Range: Negative) Leukocyte Esterace: negative   (Normal Range: Negative)    Comments: Sid Falcon LPN  September 16, 2009 4:50 PM

## 2010-09-02 NOTE — Letter (Signed)
Summary: Generic Letter  [facility]  [address] [place], [place] [address]   Phone: [phone]  Fax: [phone]      07/06/2010   Ann Maddox [address] [place], [place]  [address]    Dear Ann Maddox,     I have tried to contact you in reference to your previous office visit. Please give me a call back at [phone]. I will be available from Mon-Fri from 8am until 5pm. I look forward to hearing from you.         Sincerely, Almeta Monas CMA (AAMA)

## 2011-01-11 ENCOUNTER — Encounter: Payer: Self-pay | Admitting: Internal Medicine

## 2011-01-11 ENCOUNTER — Ambulatory Visit (INDEPENDENT_AMBULATORY_CARE_PROVIDER_SITE_OTHER): Payer: BC Managed Care – PPO | Admitting: Internal Medicine

## 2011-01-11 VITALS — BP 116/70 | HR 76 | Temp 98.7°F | Wt 164.0 lb

## 2011-01-11 DIAGNOSIS — J0100 Acute maxillary sinusitis, unspecified: Secondary | ICD-10-CM

## 2011-01-11 MED ORDER — CLARITHROMYCIN ER 500 MG PO TB24: 1000.0000 mg | ORAL_TABLET | Freq: Every day | ORAL | Status: DC

## 2011-01-11 MED ORDER — FLUTICASONE PROPIONATE 50 MCG/ACT NA SUSP: 1.0000 | Freq: Every day | NASAL | Status: DC

## 2011-01-11 NOTE — Progress Notes (Signed)
  Subjective:    Patient ID: Ann Maddox, female    DOB: October 28, 1960, 50 y.o.   MRN: [id number]  HPI  ? Respiratory tract infection Onset/symptoms:6/8 Exposures (illness/environmental/extrinsic):daughter ill Progression of symptoms:worsening congestion Treatments/response:Neti pot bid Present symptoms:ear & teeth pressure Fever/chills/sweats:no Frontal headache:no Facial pain:yes Nasal purulence:yes Sore throat:no Dental pain:yes Lymphadenopathy:no Wheezing/shortness of breath:no Cough/sputum:no Associated extrinsic/allergic symptoms:itchy eyes/ sneezing:yes Past medical history: Seasonal allergies: yes;asthma: no Smoking history:never          Review of Systems     Objective:   Physical Exam General appearance is of good health and nourishment; no acute distress or increased work of breathing is present.  Posterior  lymphadenopathy in the  Neck;  no axillary LA  noted.   Eyes: No conjunctival inflammation or lid edema is present. There is no scleral icterus.  Ears:  External ear exam shows no significant lesions or deformities.  Otoscopic examination reveals clear canals, tympanic membranes are intact bilaterally without bulging, retraction, inflammation or discharge.  Nose:  External nasal examination shows no deformity or inflammation. Nasal mucosa are dry  without lesions or exudates. No septal dislocation or dislocation.No obstruction to airflow.   Oral exam: Dental hygiene is good; lips and gums are healthy appearing.There is no oropharyngeal erythema or exudate noted.    Heart:  Normal rate and regular rhythm. S1 and S2 normal without gallop, murmur, click, rub or other extra sounds. S4  Lungs:Chest clear to auscultation; no wheezes, rhonchi,rales ,or rubs present.No increased work of breathing.    Extremities:  No cyanosis, edema, or clubbing  noted    Skin: Warm & dry w/o jaundice or tenting.          Assessment & Plan:  #1 maxillary  sinusitis Plan:see Orders

## 2011-01-11 NOTE — Patient Instructions (Signed)
Plain Mucinex for thick secretions ;force NON dairy fluids for next 48 hrs.  

## 2011-01-12 ENCOUNTER — Other Ambulatory Visit: Payer: Self-pay | Admitting: Internal Medicine

## 2011-01-12 NOTE — Telephone Encounter (Signed)
Dr.Hopper please advise 

## 2011-05-03 IMAGING — CR DG FOOT COMPLETE 3+V*L*
3 series · 3 of 3 positions shown · non-contrast
Comparison: None

CLINICAL DATA: Foot pain MTP joint

LEFT FOOT - COMPLETE 3+ VIEW

[view not recorded (1 of 3)]
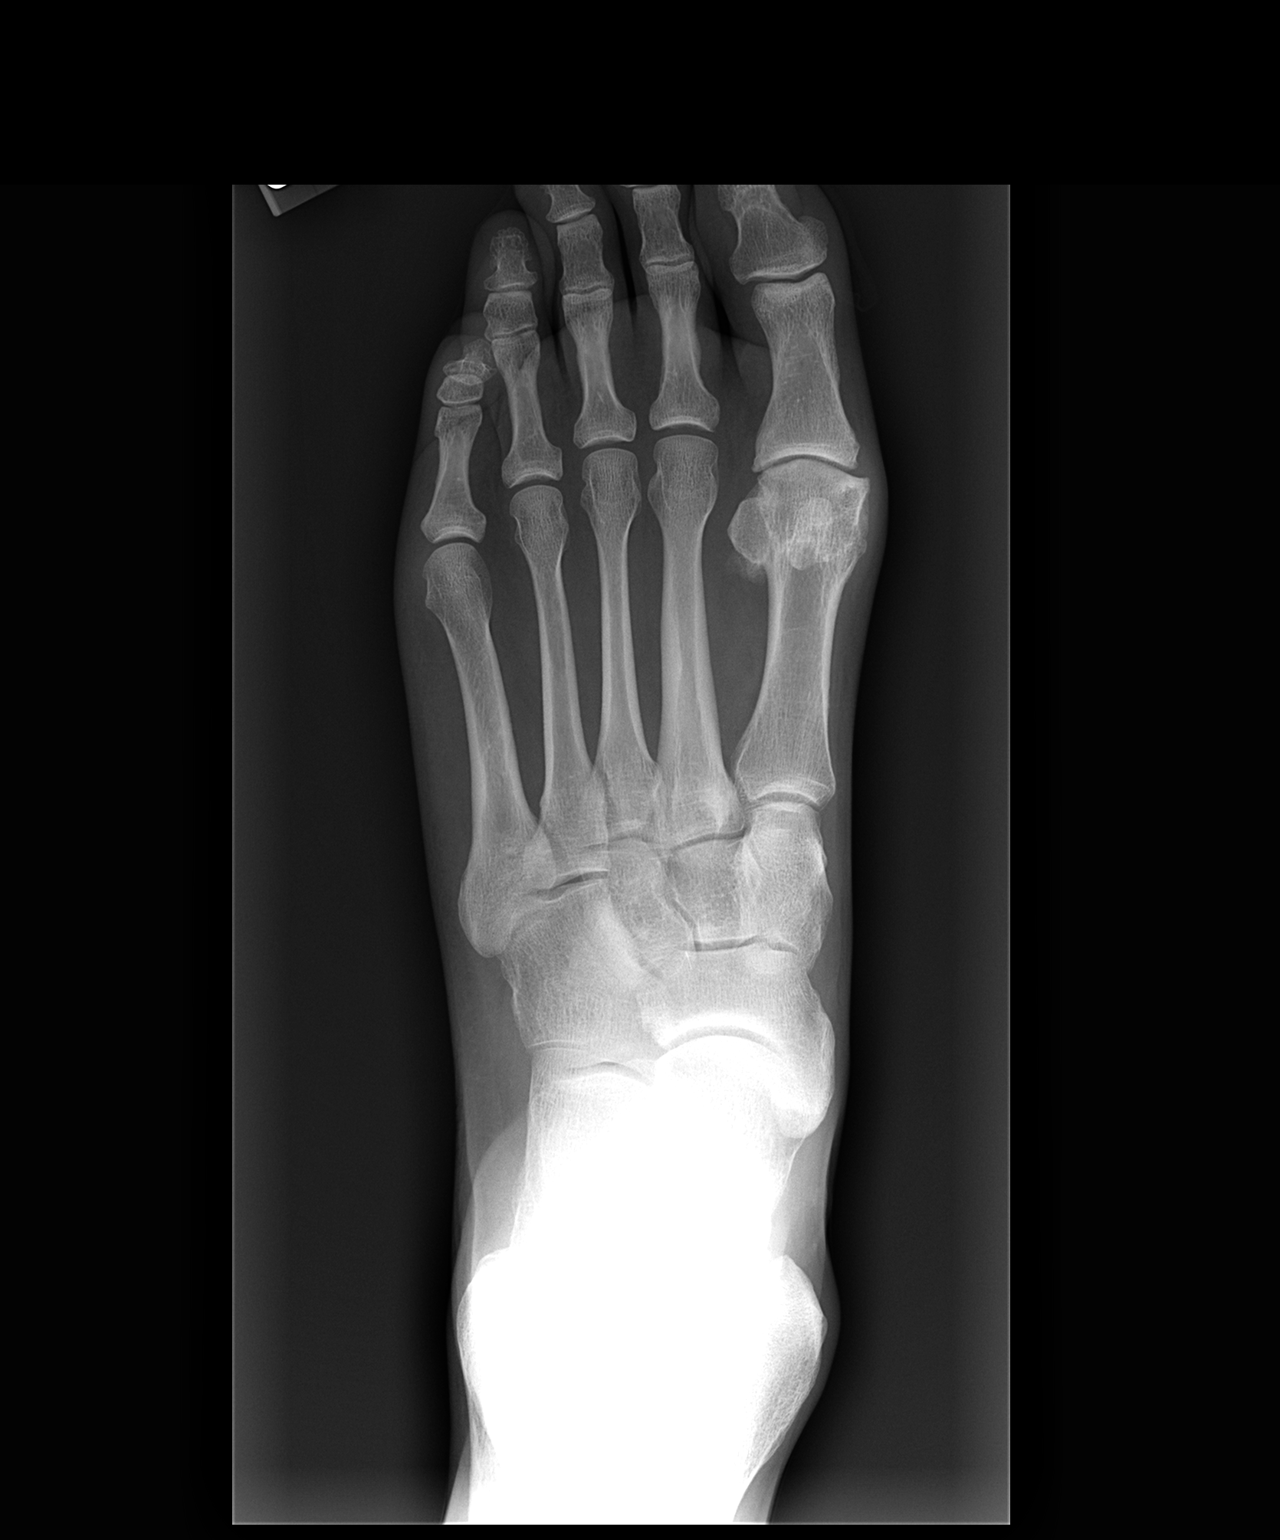

[view not recorded (2 of 3)]
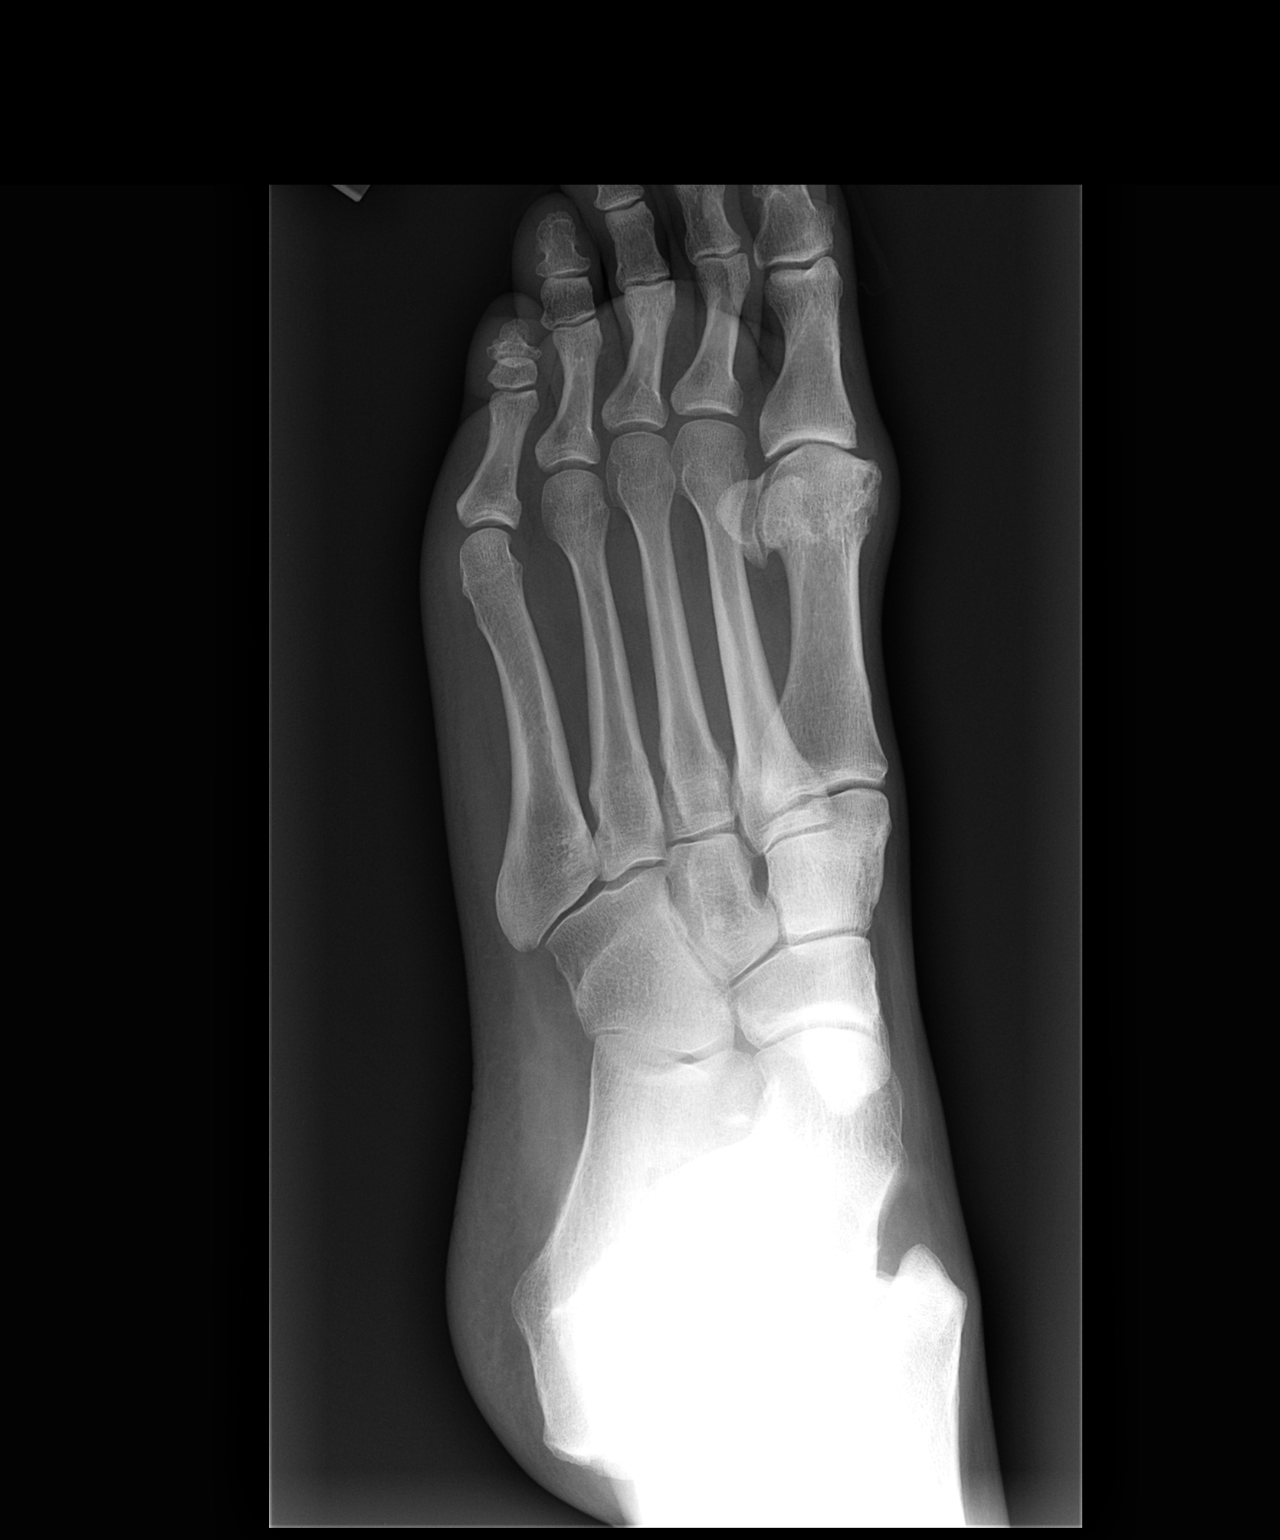

[view not recorded (3 of 3)]
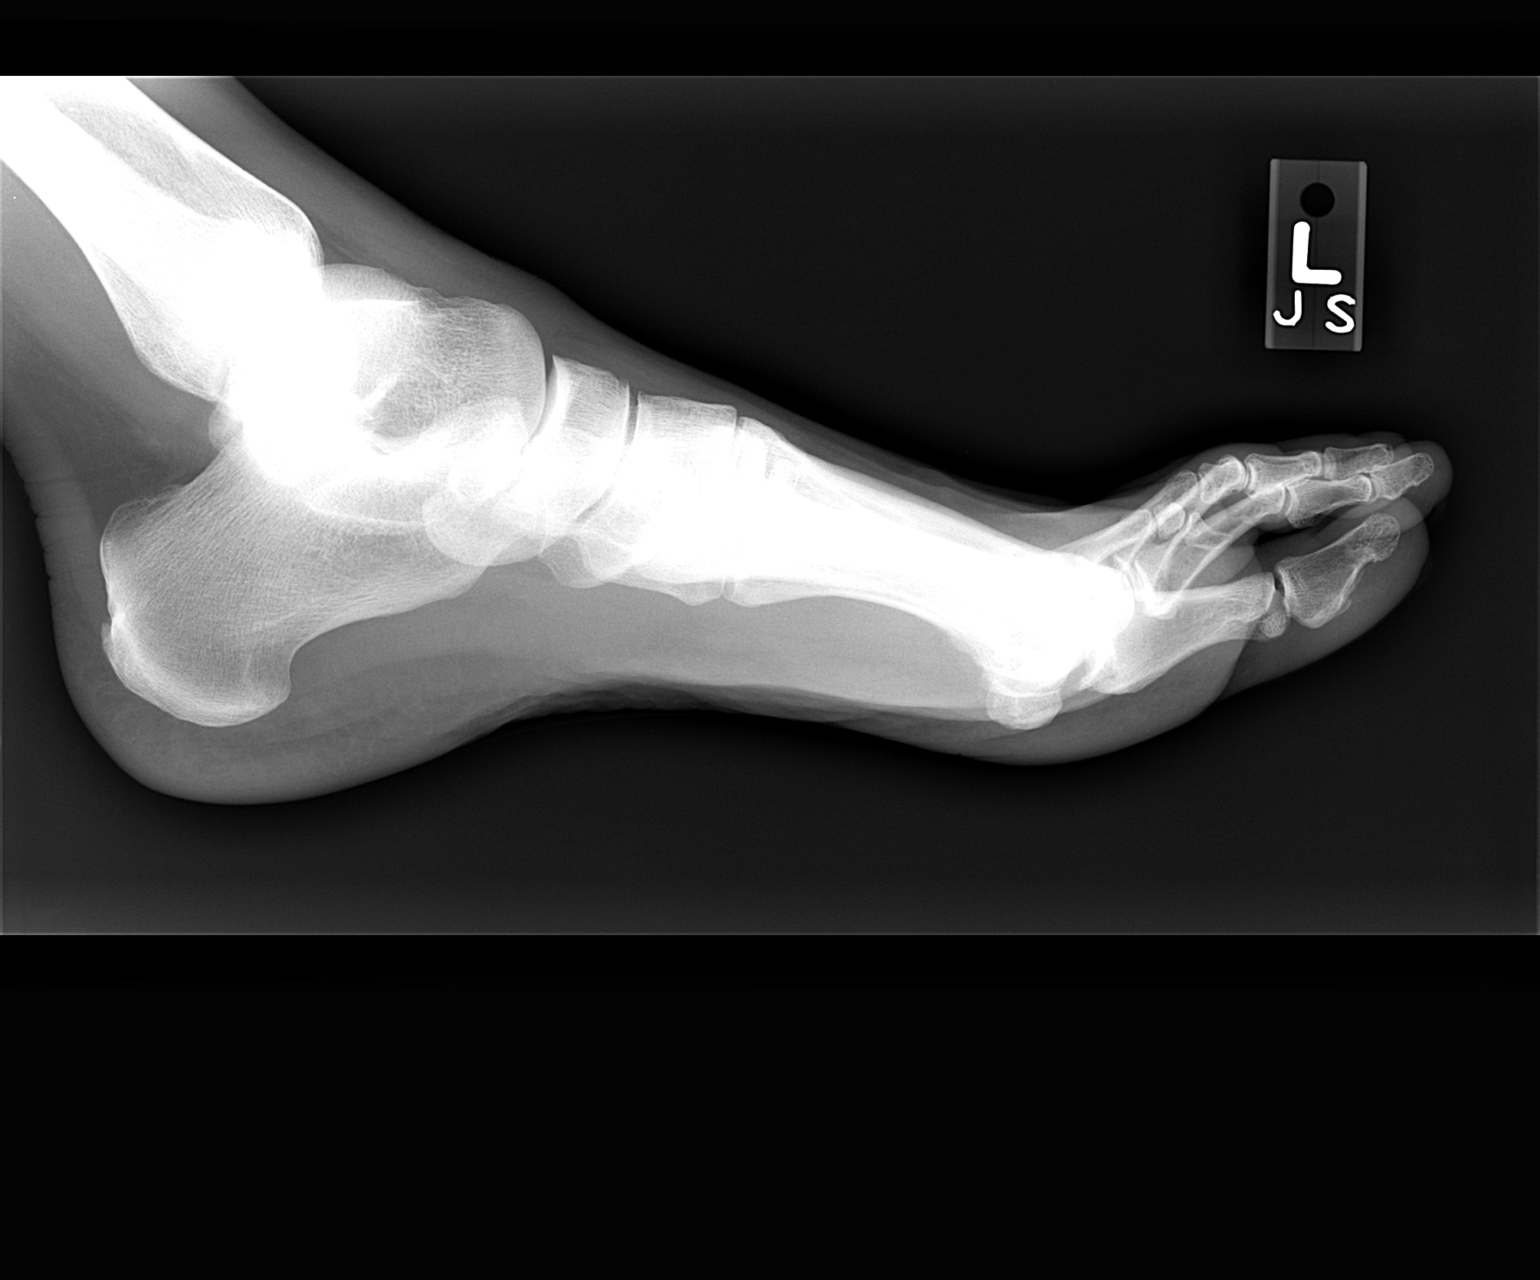

[3 of 3 positions shown; findings below may reference images not displayed]

FINDINGS: No evidence of fracture or  dislocation of the left foot.
Mild  joint space narrowing at the MTP joint.  No significant
osteoarthritis.
IMPRESSION: Mild joint space narrowing at the first MTP.

## 2011-05-03 IMAGING — CR DG SHOULDER 2+V*L*
3 series · 3 of 3 positions shown · non-contrast
Comparison: None

CLINICAL DATA: Left shoulder attaches

LEFT SHOULDER - 2+ VIEW

[view not recorded (1 of 3)]
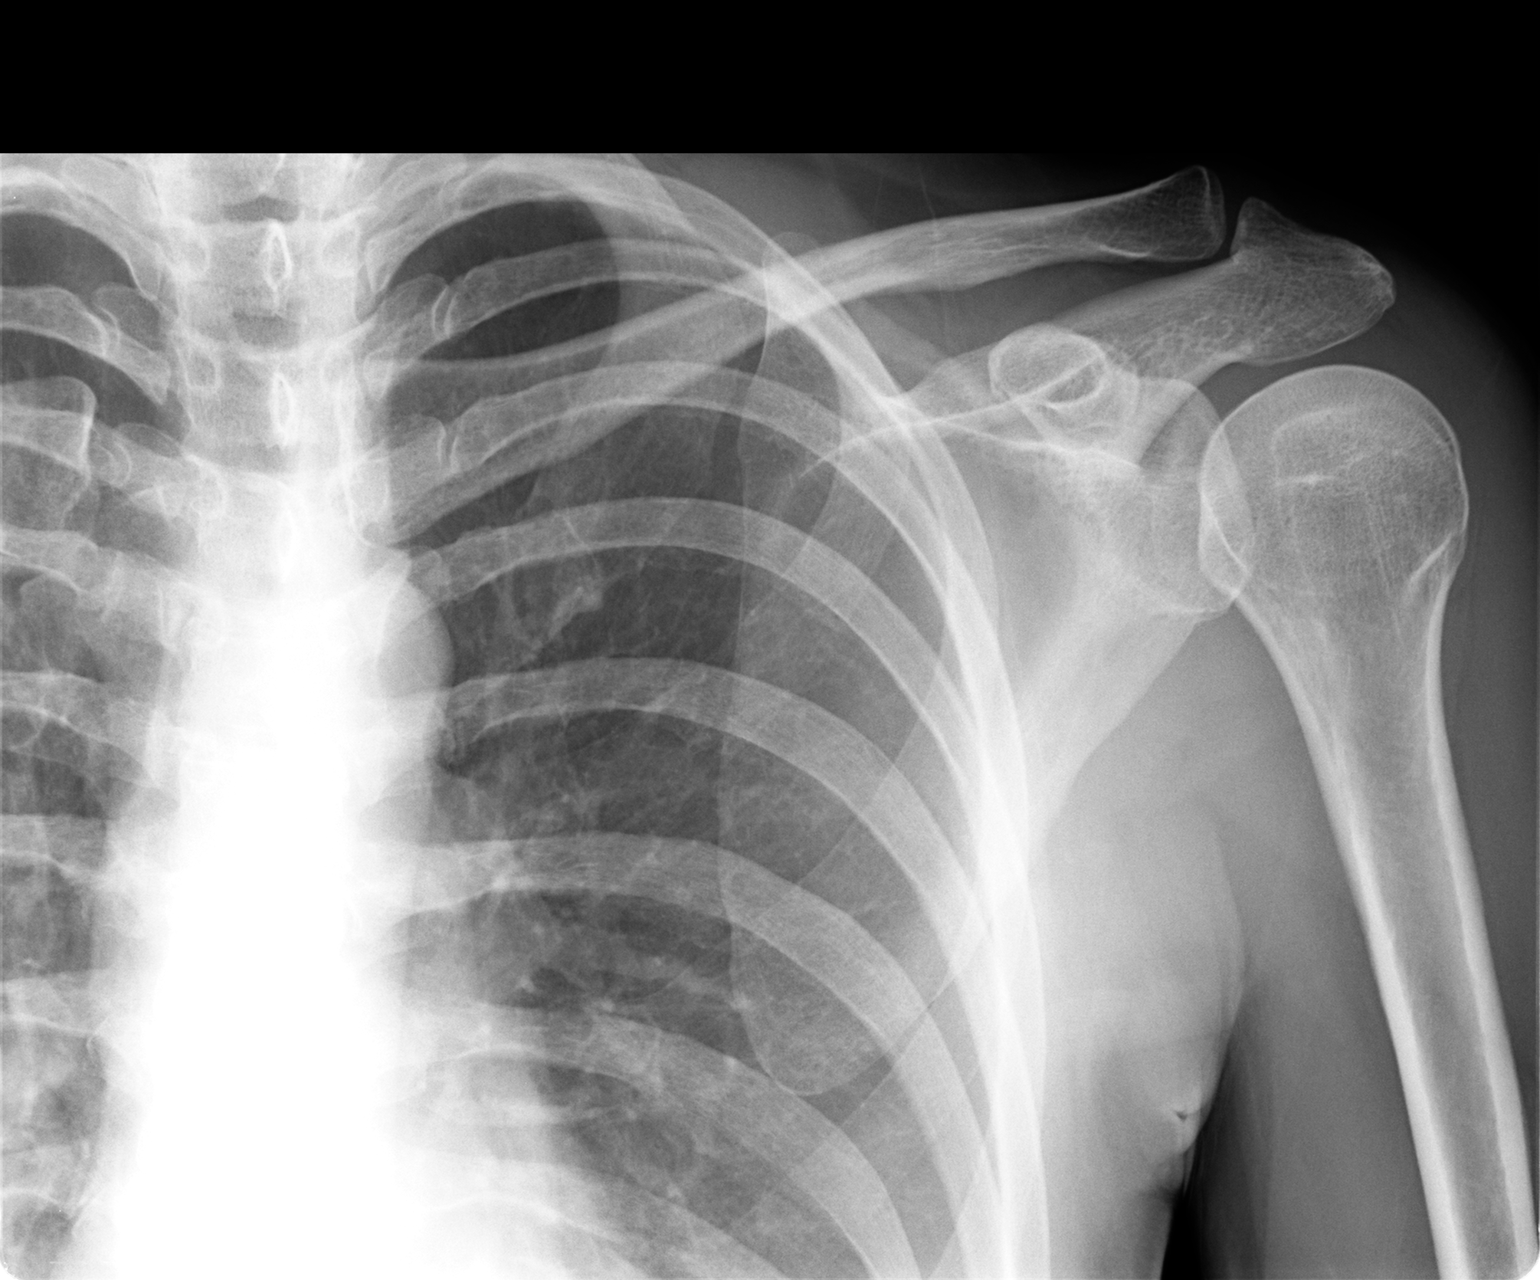

[view not recorded (2 of 3)]
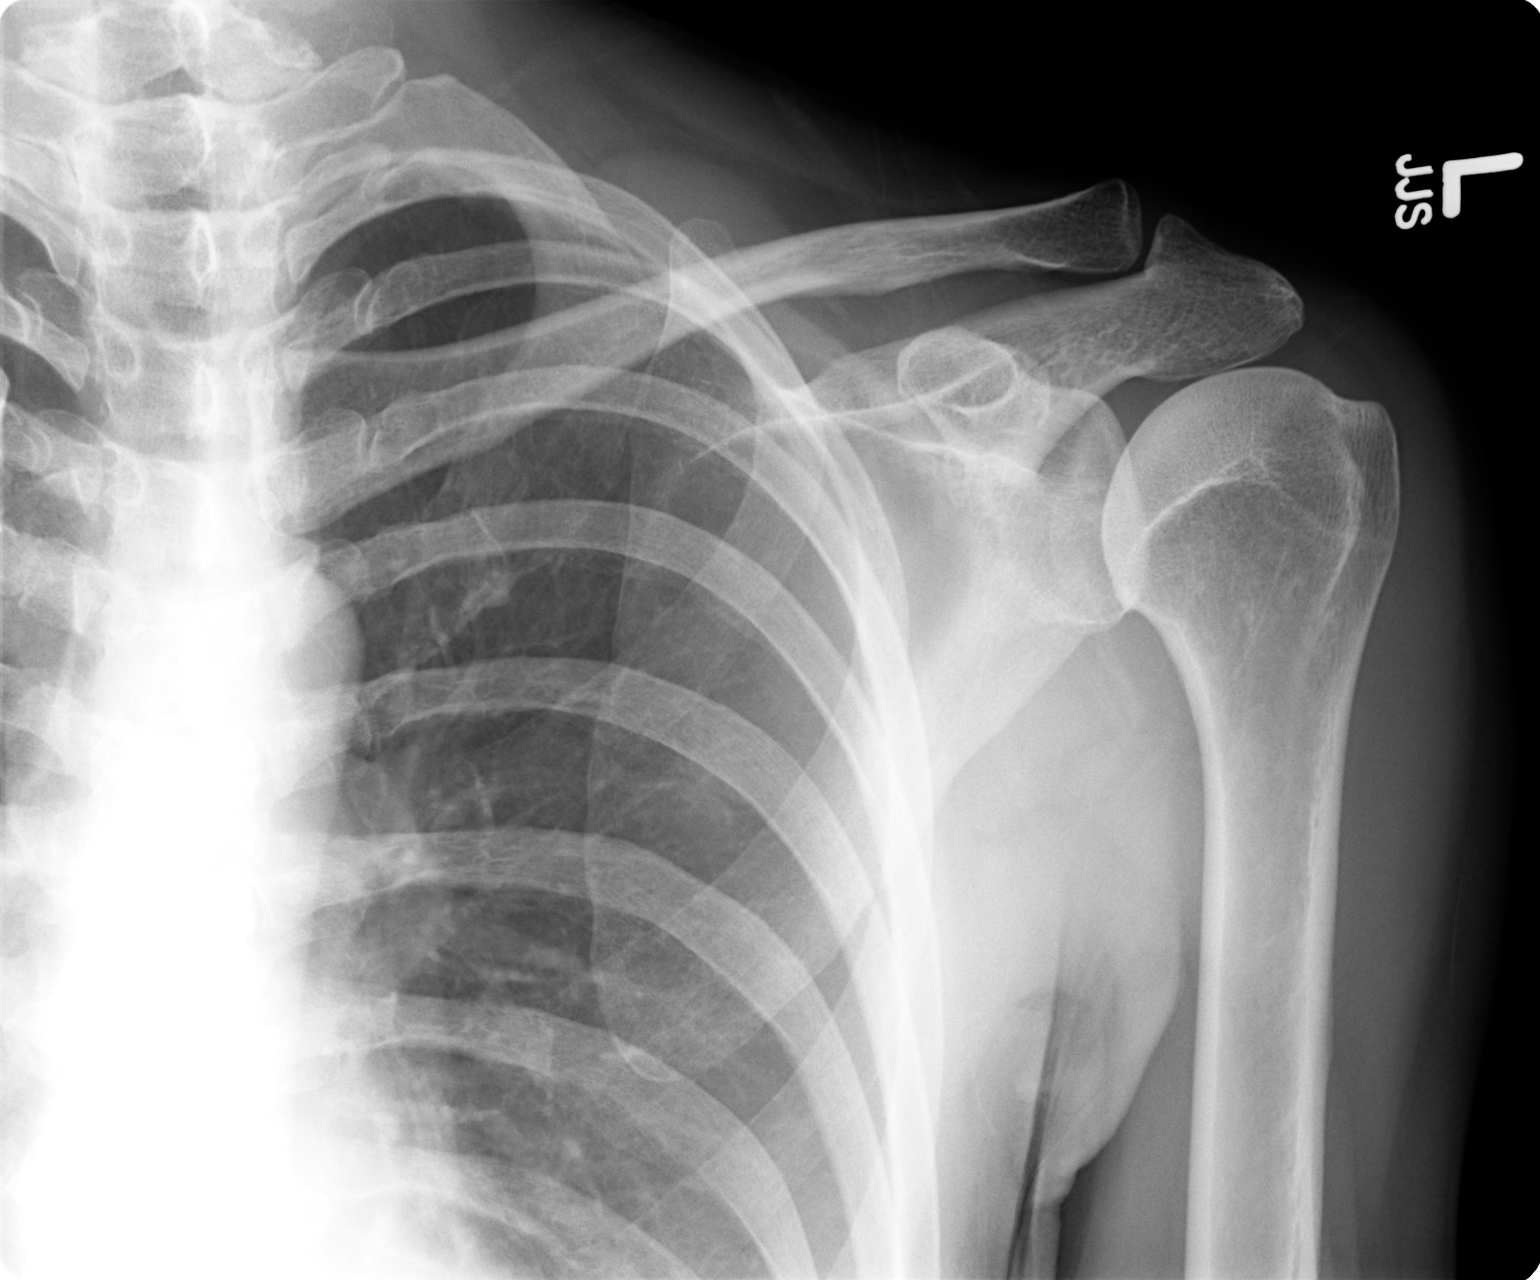

[view not recorded (3 of 3)]
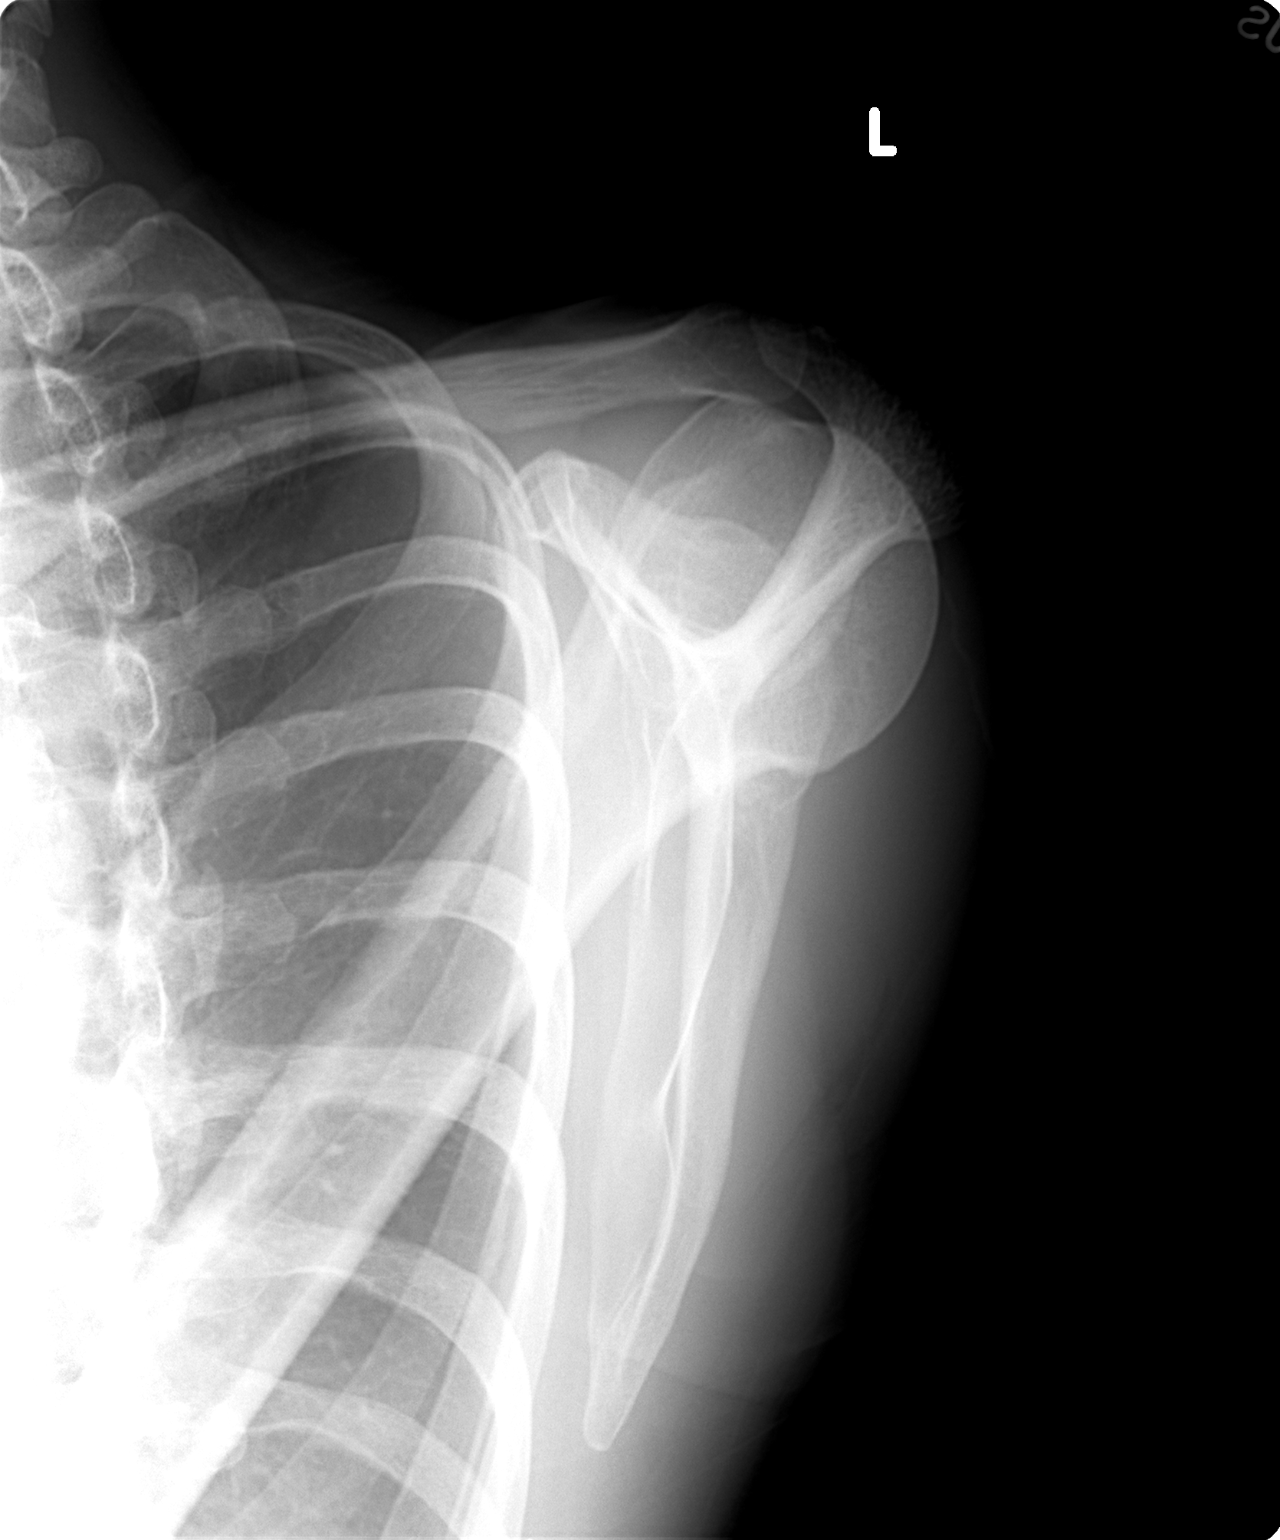

[3 of 3 positions shown; findings below may reference images not displayed]

FINDINGS: No evidence of fracture or dislocation of the left
shoulder.  Acromioclavicular joint is normal.  Glenohumeral joint
appears normal.
IMPRESSION: No acute or chronic findings of the left shoulder by radiograph.

## 2011-09-05 ENCOUNTER — Ambulatory Visit (INDEPENDENT_AMBULATORY_CARE_PROVIDER_SITE_OTHER): Payer: BC Managed Care – PPO | Admitting: Family Medicine

## 2011-09-05 ENCOUNTER — Encounter: Payer: Self-pay | Admitting: Family Medicine

## 2011-09-05 DIAGNOSIS — J0100 Acute maxillary sinusitis, unspecified: Secondary | ICD-10-CM

## 2011-09-05 DIAGNOSIS — M539 Dorsopathy, unspecified: Secondary | ICD-10-CM

## 2011-09-05 DIAGNOSIS — M62830 Muscle spasm of back: Secondary | ICD-10-CM | POA: Insufficient documentation

## 2011-09-05 DIAGNOSIS — J329 Chronic sinusitis, unspecified: Secondary | ICD-10-CM | POA: Insufficient documentation

## 2011-09-05 MED ORDER — CLARITHROMYCIN ER 500 MG PO TB24: 1000.0000 mg | ORAL_TABLET | Freq: Every day | ORAL | Status: AC

## 2011-09-05 MED ORDER — MOMETASONE FUROATE 50 MCG/ACT NA SUSP: 2.0000 | Freq: Every day | NASAL | Status: DC | PRN

## 2011-09-05 MED ORDER — BENZONATATE 200 MG PO CAPS: 200.0000 mg | ORAL_CAPSULE | Freq: Three times a day (TID) | ORAL | Status: AC | PRN

## 2011-09-05 MED ORDER — CYCLOBENZAPRINE HCL 10 MG PO TABS: 10.0000 mg | ORAL_TABLET | Freq: Three times a day (TID) | ORAL | Status: DC | PRN

## 2011-09-05 NOTE — Assessment & Plan Note (Signed)
Pt's sxs consistent w/ infxn.  Pt has already started abx.  Continue course.  Reviewed supportive care and red flags that should prompt return.  Pt expressed understanding and is in agreement w/ plan.

## 2011-09-05 NOTE — Progress Notes (Signed)
  Subjective:    Patient ID: Ann Maddox, female    DOB: [birth year], 51 y.o.   MRN: [id number]  HPI Sinusitis- sxs started in early January w/ a cold.  Started Biaxin on Saturday- took 2 on Saturday and 2 on Sunday.  sxs started on Tuesday w/ body aches, low grade temps (subjective).  + nasal congestion, bilateral ear pressure, facial pressure.  No tooth pain.  Using Netti pot- initially didn't get any return, then was clear and then green.  + sick contacts.  Lower back spasms- hx of similar, previously had muscle relaxers for prn use.  Had L hip replacement, due for R.  Compensating for hip pain is causing back discomfort.   Review of Systems For ROS see HPI     Objective:   Physical Exam  Vitals reviewed. Constitutional: She appears well-developed and well-nourished. No distress.  HENT:  Head: Normocephalic and atraumatic.  Right Ear: Tympanic membrane normal.  Left Ear: Tympanic membrane normal.  Nose: Mucosal edema and rhinorrhea present. Right sinus exhibits maxillary sinus tenderness and frontal sinus tenderness. Left sinus exhibits maxillary sinus tenderness and frontal sinus tenderness.  Mouth/Throat: Uvula is midline and mucous membranes are normal. Posterior oropharyngeal erythema present. No oropharyngeal exudate.  Eyes: Conjunctivae and EOM are normal. Pupils are equal, round, and reactive to light.  Neck: Normal range of motion. Neck supple.  Cardiovascular: Normal rate, regular rhythm and normal heart sounds.   Pulmonary/Chest: Effort normal and breath sounds normal. No respiratory distress. She has no wheezes.  Lymphadenopathy:    She has no cervical adenopathy.          Assessment & Plan:

## 2011-09-05 NOTE — Assessment & Plan Note (Signed)
Restart Flexeril prn.  Script given.

## 2011-09-05 NOTE — Patient Instructions (Signed)
This is a sinus infection Continue the Biaxin- 2 tabs at the same time w/ food Use the Tessalon as needed for cough Drink plenty of fluids REST! Hang in there!!

## 2011-10-21 ENCOUNTER — Ambulatory Visit (INDEPENDENT_AMBULATORY_CARE_PROVIDER_SITE_OTHER): Payer: BC Managed Care – PPO | Admitting: Emergency Medicine

## 2011-10-21 ENCOUNTER — Ambulatory Visit: Payer: BC Managed Care – PPO

## 2011-10-21 VITALS — BP 115/64 | HR 80 | Temp 97.4°F | Resp 16 | Ht 72.0 in | Wt 164.2 lb

## 2011-10-21 DIAGNOSIS — N390 Urinary tract infection, site not specified: Secondary | ICD-10-CM

## 2011-10-21 DIAGNOSIS — R350 Frequency of micturition: Secondary | ICD-10-CM

## 2011-10-21 DIAGNOSIS — B373 Candidiasis of vulva and vagina: Secondary | ICD-10-CM

## 2011-10-21 DIAGNOSIS — R300 Dysuria: Secondary | ICD-10-CM

## 2011-10-21 LAB — POCT UA - MICROSCOPIC ONLY

## 2011-10-21 LAB — POCT URINALYSIS DIPSTICK
Bilirubin, UA: NEGATIVE
Glucose, UA: NEGATIVE
Nitrite, UA: NEGATIVE

## 2011-10-21 MED ORDER — CIPROFLOXACIN HCL 250 MG PO TABS: 250.0000 mg | ORAL_TABLET | Freq: Two times a day (BID) | ORAL | Status: AC

## 2011-10-21 MED ORDER — FLUCONAZOLE 150 MG PO TABS: ORAL_TABLET | ORAL | Status: DC

## 2011-10-21 NOTE — Patient Instructions (Signed)

## 2011-10-21 NOTE — Progress Notes (Signed)
  Subjective:    Patient ID: Ann Maddox, female    DOB: 03-09-1961, 51 y.o.   MRN: [id number]  HPI patient enters with onset this morning urinary discomfort. She has pain on urination and the last time she urinated she did notice some blood. She was treated in February for a sinus infection with Biaxin and    Review of Systems review of systems is that the patient continues to have her minister. In fact she is on her menstrual period now. She denies any vaginal discharge.     Objective:   Physical Exam physical exam the neck is supple her chest is clear there is no CVA tenderness. Abdomen is soft there is minimal suprapubic tenderness.        Assessment & Plan:  Symptoms are consistent with urinary tract infection. She also has a history of developing yeast infections on antibiotics

## 2011-10-24 LAB — URINE CULTURE: Colony Count: >=100000

## 2011-11-24 ENCOUNTER — Telehealth: Payer: Self-pay

## 2011-11-24 MED ORDER — NITROFURANTOIN MONOHYD MACRO 100 MG PO CAPS: 100.0000 mg | ORAL_CAPSULE | Freq: Two times a day (BID) | ORAL | Status: AC

## 2011-11-24 NOTE — Telephone Encounter (Signed)
Ok to call in Boring, see meds and orders.  Ann Maddox

## 2011-11-24 NOTE — Telephone Encounter (Signed)
°  PT INDICATES UTI FOR WHICH SHE WAS TREATED IN [facility] HAS RETURNED.  SHE IS ON VACATION, CAMPING, BUT HAS ACCESS TO [facility].  WOULD LIKE RX TO TREAT HER UNTIL HER RETURN ON Monday.  HAS TAKEN ONE DIFURTAN FOR A YEAST INFECTION AS WELL.  STRIP TESTED HERSELF, LEUKOCYTES PURPLE.  BEST NUMBER IS [phone]

## 2011-11-25 NOTE — Telephone Encounter (Signed)
Spoke with patient and called pharmacy to order macrobid

## 2011-11-25 NOTE — Telephone Encounter (Signed)
LMOM to call back

## 2012-02-27 ENCOUNTER — Ambulatory Visit (INDEPENDENT_AMBULATORY_CARE_PROVIDER_SITE_OTHER): Payer: Self-pay | Admitting: Family Medicine

## 2012-02-27 ENCOUNTER — Encounter: Payer: Self-pay | Admitting: Family Medicine

## 2012-02-27 VITALS — BP 124/72 | HR 77 | Temp 98.0°F | Wt 173.8 lb

## 2012-02-27 DIAGNOSIS — R5381 Other malaise: Secondary | ICD-10-CM

## 2012-02-27 DIAGNOSIS — R5383 Other fatigue: Secondary | ICD-10-CM

## 2012-02-27 DIAGNOSIS — N390 Urinary tract infection, site not specified: Secondary | ICD-10-CM

## 2012-02-27 LAB — POCT URINALYSIS DIPSTICK
Glucose, UA: NEGATIVE
Nitrite, UA: NEGATIVE
Urobilinogen, UA: 0.2

## 2012-02-27 MED ORDER — CIPROFLOXACIN HCL 500 MG PO TABS: 500.0000 mg | ORAL_TABLET | Freq: Two times a day (BID) | ORAL | Status: AC

## 2012-02-27 NOTE — Patient Instructions (Addendum)

## 2012-02-27 NOTE — Progress Notes (Signed)
  Subjective:    Ann Maddox is a 51 y.o. female who complains of burning with urination and pain in the lower abdomen. She has had symptoms for 1 day. Patient also complains of stomach ache. Patient denies back pain, congestion, cough, fever, headache, rhinitis, sorethroat, stomach ache and vaginal discharge. Patient does have a history of recurrent UTI. Patient does not have a history of pyelonephritis.   The following portions of the patient's history were reviewed and updated as appropriate: allergies, current medications, past family history, past medical history, past social history, past surgical history and problem list.  Review of Systems Pertinent items are noted in HPI.    Objective:    BP 124/72  Pulse 77  Temp 98 F (36.7 C) (Oral)  Wt 173 lb 12.8 oz (78.835 kg)  SpO2 98% General appearance: alert, cooperative, appears stated age and no distress Abdomen: abnormal findings:  mild tenderness in the lower abdomen  Laboratory:  Urine dipstick: negative for all components.   Micro exam: not done.    Assessment:    dysuria    fatigue---check labs,  Pt aware it could be all anesthesia she had during hip replacement Plan:    Maintain adequate hydration. Follow up if symptoms not improving, and as needed.

## 2012-02-28 LAB — CBC WITH DIFFERENTIAL/PLATELET
Basophils Relative: 0.4 % (ref 0.0–3.0)
Eosinophils Relative: 1.7 % (ref 0.0–5.0)
HCT: 38.3 % (ref 36.0–46.0)
Lymphs Abs: 2.2 10*3/uL (ref 0.7–4.0)
MCV: 91.9 fl (ref 78.0–100.0)
Monocytes Absolute: 0.3 10*3/uL (ref 0.1–1.0)
Monocytes Relative: 5.8 % (ref 3.0–12.0)
RBC: 4.17 Mil/uL (ref 3.87–5.11)
WBC: 5.2 10*3/uL (ref 4.5–10.5)

## 2012-02-28 LAB — BASIC METABOLIC PANEL
Chloride: 101 mEq/L (ref 96–112)
GFR: 75 mL/min (ref >60.00)
Potassium: 4 mEq/L (ref 3.5–5.1)
Sodium: 138 mEq/L (ref 135–145)

## 2012-02-28 LAB — HEPATIC FUNCTION PANEL
ALT: 20 U/L (ref 0–35)
AST: 21 U/L (ref 0–37)
Bilirubin, Direct: 0.1 mg/dL (ref 0.0–0.3)
Total Bilirubin: 0.5 mg/dL (ref 0.3–1.2)
Total Protein: 7.4 g/dL (ref 6.0–8.3)

## 2012-02-28 LAB — URINE CULTURE
Colony Count: NO GROWTH
Organism ID, Bacteria: NO GROWTH

## 2012-02-28 LAB — T4, FREE: Free T4: 0.87 ng/dL (ref 0.60–1.60)

## 2012-02-28 LAB — FERRITIN: Ferritin: 33.2 ng/mL (ref 10.0–291.0)

## 2012-02-28 LAB — IBC PANEL: Saturation Ratios: 25.9 % (ref 20.0–50.0)

## 2012-02-29 LAB — VITAMIN B12: Vitamin B-12: 685 pg/mL (ref 211–911)

## 2012-05-15 ENCOUNTER — Telehealth: Payer: Self-pay | Admitting: Family Medicine

## 2012-05-15 DIAGNOSIS — Z1211 Encounter for screening for malignant neoplasm of colon: Secondary | ICD-10-CM

## 2012-05-15 NOTE — Telephone Encounter (Signed)
Please schedule mammograms at the facility she uses if it has been 12 months since last mammogram. Dr. Laury Axon can address the issue of colonoscopy when she returns. If she is having active GI symptoms; she needs an office visit rather than referral for colonoscopy.

## 2012-05-15 NOTE — Telephone Encounter (Signed)
Dr.Lowne is patient's primary care doctor, it is not possible to have 2 primary care doctors in the same facility.  Dr.Hopper please advise on patient request for Dr.Lowne is out of the office

## 2012-05-15 NOTE — Telephone Encounter (Signed)
Left message on voicemail for patient to return call. 

## 2012-05-15 NOTE — Telephone Encounter (Signed)
pt needs referral for mammogram & Colonoscopy done ASAP cb# [phone], this is DUE TO INSURANCE needs by the end of the year Cb# [phone] NOTE pt states she see both Hopp & lowne as her primary??

## 2012-05-17 NOTE — Telephone Encounter (Signed)
Ok to refer for colonoscopy- dx colon cancer screen

## 2012-05-17 NOTE — Telephone Encounter (Signed)
Spoke with patient and she stated she needed the colonoscopy before the end of Nov, she is on the wait list for her CPE but has one scheduled in 08/2012 and her Mammogram is scheduled for 06/06/12. She wanted to know if we could order the preventative colonoscopy, she is not having any problems just needs this done for her insurance company. Please advise     KP

## 2012-05-31 ENCOUNTER — Other Ambulatory Visit: Payer: Self-pay | Admitting: Family Medicine

## 2012-05-31 DIAGNOSIS — Z0000 Encounter for general adult medical examination without abnormal findings: Secondary | ICD-10-CM

## 2012-05-31 DIAGNOSIS — Z1239 Encounter for other screening for malignant neoplasm of breast: Secondary | ICD-10-CM

## 2012-05-31 DIAGNOSIS — Z1231 Encounter for screening mammogram for malignant neoplasm of breast: Secondary | ICD-10-CM

## 2012-05-31 NOTE — Addendum Note (Signed)
Addended by: Arnette Norris on: 05/31/2012 03:44 PM   Modules accepted: Orders

## 2012-06-01 ENCOUNTER — Encounter: Payer: Self-pay | Admitting: Gastroenterology

## 2012-06-01 ENCOUNTER — Encounter: Payer: Self-pay | Admitting: Internal Medicine

## 2012-06-06 ENCOUNTER — Ambulatory Visit (AMBULATORY_SURGERY_CENTER): Payer: BC Managed Care – PPO | Admitting: *Deleted

## 2012-06-06 VITALS — Ht 72.0 in | Wt 181.4 lb

## 2012-06-06 DIAGNOSIS — Z1211 Encounter for screening for malignant neoplasm of colon: Secondary | ICD-10-CM

## 2012-06-06 MED ORDER — MOVIPREP 100 G PO SOLR: ORAL | Status: DC

## 2012-06-18 ENCOUNTER — Encounter: Payer: Self-pay | Admitting: Gastroenterology

## 2012-06-18 ENCOUNTER — Ambulatory Visit (AMBULATORY_SURGERY_CENTER): Payer: BC Managed Care – PPO | Admitting: Gastroenterology

## 2012-06-18 VITALS — BP 153/85 | HR 53 | Temp 97.2°F | Resp 18 | Ht 72.0 in | Wt 181.0 lb

## 2012-06-18 DIAGNOSIS — Z1211 Encounter for screening for malignant neoplasm of colon: Secondary | ICD-10-CM

## 2012-06-18 MED ORDER — SODIUM CHLORIDE 0.9 % IV SOLN: 500.0000 mL | INTRAVENOUS | Status: DC

## 2012-06-18 NOTE — Progress Notes (Signed)
Patient did not experience any of the following events: a burn prior to discharge; a fall within the facility; wrong site/side/patient/procedure/implant event; or a hospital transfer or hospital admission upon discharge from the facility. (G8907) Patient did not have preoperative order for IV antibiotic SSI prophylaxis. (G8918)  

## 2012-06-18 NOTE — Patient Instructions (Addendum)
Discharge instructions given with verbal understanding. Resume previous medications.YOU HAD AN ENDOSCOPIC PROCEDURE TODAY AT THE [facility]: Refer to the procedure report that was given to you for any specific questions about what was found during the examination.  If the procedure report does not answer your questions, please call your gastroenterologist to clarify.  If you requested that your care partner not be given the details of your procedure findings, then the procedure report has been included in a sealed envelope for you to review at your convenience later.  YOU SHOULD EXPECT: Some feelings of bloating in the abdomen. Passage of more gas than usual.  Walking can help get rid of the air that was put into your GI tract during the procedure and reduce the bloating. If you had a lower endoscopy (such as a colonoscopy or flexible sigmoidoscopy) you may notice spotting of blood in your stool or on the toilet paper. If you underwent a bowel prep for your procedure, then you may not have a normal bowel movement for a few days.  DIET: Your first meal following the procedure should be a light meal and then it is ok to progress to your normal diet.  A half-sandwich or bowl of soup is an example of a good first meal.  Heavy or fried foods are harder to digest and may make you feel nauseous or bloated.  Likewise meals heavy in dairy and vegetables can cause extra gas to form and this can also increase the bloating.  Drink plenty of fluids but you should avoid alcoholic beverages for 24 hours.  ACTIVITY: Your care partner should take you home directly after the procedure.  You should plan to take it easy, moving slowly for the rest of the day.  You can resume normal activity the day after the procedure however you should NOT DRIVE or use heavy machinery for 24 hours (because of the sedation medicines used during the test).    SYMPTOMS TO REPORT IMMEDIATELY: A gastroenterologist can be reached at  any hour.  During normal business hours, 8:30 AM to 5:00 PM Monday through Friday, call [phone].  After hours and on weekends, please call the GI answering service at [phone] who will take a message and have the physician on call contact you.   Following lower endoscopy (colonoscopy or flexible sigmoidoscopy):  Excessive amounts of blood in the stool  Significant tenderness or worsening of abdominal pains  Swelling of the abdomen that is new, acute  Fever of 100F or higher  FOLLOW UP: If any biopsies were taken you will be contacted by phone or by letter within the next 1-3 weeks.  Call your gastroenterologist if you have not heard about the biopsies in 3 weeks.  Our staff will call the home number listed on your records the next business day following your procedure to check on you and address any questions or concerns that you may have at that time regarding the information given to you following your procedure. This is a courtesy call and so if there is no answer at the home number and we have not heard from you through the emergency physician on call, we will assume that you have returned to your regular daily activities without incident.  SIGNATURES/CONFIDENTIALITY: You and/or your care partner have signed paperwork which will be entered into your electronic medical record.  These signatures attest to the fact that that the information above on your After Visit Summary has been reviewed and is understood.    Full responsibility of the confidentiality of this discharge information lies with you and/or your care-partner.  

## 2012-06-18 NOTE — Op Note (Signed)
[facility] [address] [place], [address]   COLONOSCOPY PROCEDURE REPORT  PATIENT: Ann Maddox, Ann Maddox  MR#: [id number] BIRTHDATE: 1961/06/27 , 51  yrs. old GENDER: Female ENDOSCOPIST: Mardella Layman, MD, [facility] REFERRED BY:  Loreen Freud, DO PROCEDURE DATE:  06/18/2012 PROCEDURE:   Colonoscopy, screening ASA CLASS:   Class II INDICATIONS:average risk patient for colon cancer and constipation.  MEDICATIONS: propofol (Diprivan) 400mg  IV  DESCRIPTION OF PROCEDURE:   After the risks and benefits and of the procedure were explained, informed consent was obtained.  A digital rectal exam revealed no abnormalities of the rectum.    The endoscope was introduced through the anus and advanced to the cecum, which was identified by both the appendix and ileocecal valve .  The quality of the prep was poor, using MoviPrep .  The instrument was then slowly withdrawn as the colon was fully examined.     COLON FINDINGS: The colon was redundant.  Manual abdominal counter-pressure was used to reach the cecum.   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   The colon mucosa was otherwise normal. Retroflexed views revealed no abnormalities.     The scope was then withdrawn from the patient and the procedure completed.  COMPLICATIONS: There were no complications. ENDOSCOPIC IMPRESSION: 1.   The colon was redundant and atonic and thus a vert difficult exam with a very POOR PREP!!!! 2.   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 3.   The colon mucosa was otherwise normal  RECOMMENDATIONS: 1.  Continue current medications 2.  Titrate to need.Marland KitchenMIRALAX 3.  Metamucil or benefiber 4.  Repeat Colonoscopy in 5 years.   REPEAT EXAM:  cc:  _______________________________ eSignedMardella Layman, MD, [facility] 06/18/2012 11:09 AM     PATIENT NAME:  [name], [name] MR#: [id number]

## 2012-06-19 ENCOUNTER — Telehealth: Payer: Self-pay

## 2012-06-19 NOTE — Telephone Encounter (Signed)
  Follow up Call-  Call back number 06/18/2012  Post procedure Call Back phone  # [phone]  Permission to leave phone message Yes     Patient questions:  Do you have a fever, pain , or abdominal swelling? no Pain Score  0 *  Have you tolerated food without any problems? yes  Have you been able to return to your normal activities? yes  Do you have any questions about your discharge instructions: Diet   no Medications  no Follow up visit  no  Do you have questions or concerns about your Care? no  Actions: * If pain score is 4 or above: No action needed, pain <4.  Per the pt she said she has a hx of chronic constipation and that she was not full cleaned out yesterday.  Per the pt her abdomen did still feel bloated but no pain.  I advised her to avoid gas forming foods until the bloating is gone.  I advised her to call us back if sx do not improve.  She said she would. Maw

## 2012-07-02 ENCOUNTER — Encounter: Payer: Self-pay | Admitting: Family Medicine

## 2012-07-19 ENCOUNTER — Encounter: Payer: Self-pay | Admitting: Internal Medicine

## 2012-08-13 ENCOUNTER — Encounter: Payer: Self-pay | Admitting: Lab

## 2012-08-14 ENCOUNTER — Ambulatory Visit (INDEPENDENT_AMBULATORY_CARE_PROVIDER_SITE_OTHER): Payer: BC Managed Care – PPO | Admitting: Family Medicine

## 2012-08-14 ENCOUNTER — Encounter: Payer: Self-pay | Admitting: Family Medicine

## 2012-08-14 ENCOUNTER — Other Ambulatory Visit (HOSPITAL_COMMUNITY)
Admission: RE | Admit: 2012-08-14 | Discharge: 2012-08-14 | Disposition: A | Discharge status: Home / Self Care | Payer: BC Managed Care – PPO | Source: Ambulatory Visit | Attending: Family Medicine | Admitting: Family Medicine

## 2012-08-14 VITALS — BP 140/72 | HR 68 | Temp 98.2°F | Ht 72.25 in | Wt 179.6 lb

## 2012-08-14 DIAGNOSIS — R52 Pain, unspecified: Secondary | ICD-10-CM

## 2012-08-14 DIAGNOSIS — Z01419 Encounter for gynecological examination (general) (routine) without abnormal findings: Secondary | ICD-10-CM | POA: Insufficient documentation

## 2012-08-14 DIAGNOSIS — J302 Other seasonal allergic rhinitis: Secondary | ICD-10-CM

## 2012-08-14 DIAGNOSIS — Z0000 Encounter for general adult medical examination without abnormal findings: Secondary | ICD-10-CM

## 2012-08-14 DIAGNOSIS — Z23 Encounter for immunization: Secondary | ICD-10-CM

## 2012-08-14 LAB — BASIC METABOLIC PANEL
BUN: 14 mg/dL (ref 6–23)
CO2: 30 mEq/L (ref 19–32)
Chloride: 101 mEq/L (ref 96–112)
Creatinine, Ser: 0.9 mg/dL (ref 0.4–1.2)
Glucose, Bld: 83 mg/dL (ref 70–99)
Potassium: 3.5 mEq/L (ref 3.5–5.1)

## 2012-08-14 LAB — POCT URINALYSIS DIPSTICK
Leukocytes, UA: NEGATIVE
Nitrite, UA: NEGATIVE
Protein, UA: NEGATIVE
pH, UA: 6

## 2012-08-14 LAB — CBC WITH DIFFERENTIAL/PLATELET
Basophils Absolute: 0 10*3/uL (ref 0.0–0.1)
Eosinophils Absolute: 0.1 10*3/uL (ref 0.0–0.7)
Lymphocytes Relative: 43.6 % (ref 12.0–46.0)
MCHC: 34.6 g/dL (ref 30.0–36.0)
Neutrophils Relative %: 48.9 % (ref 43.0–77.0)
RBC: 4.65 Mil/uL (ref 3.87–5.11)
RDW: 12.9 % (ref 11.5–14.6)

## 2012-08-14 LAB — LIPID PANEL
HDL: 60.3 mg/dL (ref >39.00)
Total CHOL/HDL Ratio: 3
VLDL: 17.6 mg/dL (ref 0.0–40.0)

## 2012-08-14 LAB — HEPATIC FUNCTION PANEL
Alkaline Phosphatase: 47 U/L (ref 39–117)
Bilirubin, Direct: 0.1 mg/dL (ref 0.0–0.3)

## 2012-08-14 MED ORDER — CYCLOBENZAPRINE HCL 10 MG PO TABS: 10.0000 mg | ORAL_TABLET | Freq: Three times a day (TID) | ORAL | Status: AC | PRN

## 2012-08-14 MED ORDER — MOMETASONE FUROATE 50 MCG/ACT NA SUSP: 2.0000 | Freq: Every day | NASAL | Status: DC | PRN

## 2012-08-14 NOTE — Patient Instructions (Signed)
Preventive Care for Adults, Female A healthy lifestyle and preventive care can promote health and wellness. Preventive health guidelines for women include the following key practices.  A routine yearly physical is a good way to check with your caregiver about your health and preventive screening. It is a chance to share any concerns and updates on your health, and to receive a thorough exam.  Visit your dentist for a routine exam and preventive care every 6 months. Brush your teeth twice a day and floss once a day. Good oral hygiene prevents tooth decay and gum disease.  The frequency of eye exams is based on your age, health, family medical history, use of contact lenses, and other factors. Follow your caregiver's recommendations for frequency of eye exams.  Eat a healthy diet. Foods like vegetables, fruits, whole grains, low-fat dairy products, and lean protein foods contain the nutrients you need without too many calories. Decrease your intake of foods high in solid fats, added sugars, and salt. Eat the right amount of calories for you.Get information about a proper diet from your caregiver, if necessary.  Regular physical exercise is one of the most important things you can do for your health. Most adults should get at least 150 minutes of moderate-intensity exercise (any activity that increases your heart rate and causes you to sweat) each week. In addition, most adults need muscle-strengthening exercises on 2 or more days a week.  Maintain a healthy weight. The body mass index (BMI) is a screening tool to identify possible weight problems. It provides an estimate of body fat based on height and weight. Your caregiver can help determine your BMI, and can help you achieve or maintain a healthy weight.For adults 20 years and older:  A BMI below 18.5 is considered underweight.  A BMI of 18.5 to 24.9 is normal.  A BMI of 25 to 29.9 is considered overweight.  A BMI of 30 and above is  considered obese.  Maintain normal blood lipids and cholesterol levels by exercising and minimizing your intake of saturated fat. Eat a balanced diet with plenty of fruit and vegetables. Blood tests for lipids and cholesterol should begin at age 20 and be repeated every 5 years. If your lipid or cholesterol levels are high, you are over 50, or you are at high risk for heart disease, you may need your cholesterol levels checked more frequently.Ongoing high lipid and cholesterol levels should be treated with medicines if diet and exercise are not effective.  If you smoke, find out from your caregiver how to quit. If you do not use tobacco, do not start.  If you are pregnant, do not drink alcohol. If you are breastfeeding, be very cautious about drinking alcohol. If you are not pregnant and choose to drink alcohol, do not exceed 1 drink per day. One drink is considered to be 12 ounces (355 mL) of beer, 5 ounces (148 mL) of wine, or 1.5 ounces (44 mL) of liquor.  Avoid use of street drugs. Do not share needles with anyone. Ask for help if you need support or instructions about stopping the use of drugs.  High blood pressure causes heart disease and increases the risk of stroke. Your blood pressure should be checked at least every 1 to 2 years. Ongoing high blood pressure should be treated with medicines if weight loss and exercise are not effective.  If you are 55 to 52 years old, ask your caregiver if you should take aspirin to prevent strokes.  Diabetes   screening involves taking a blood sample to check your fasting blood sugar level. This should be done once every 3 years, after age 45, if you are within normal weight and without risk factors for diabetes. Testing should be considered at a younger age or be carried out more frequently if you are overweight and have at least 1 risk factor for diabetes.  Breast cancer screening is essential preventive care for women. You should practice "breast  self-awareness." This means understanding the normal appearance and feel of your breasts and may include breast self-examination. Any changes detected, no matter how small, should be reported to a caregiver. Women in their 20s and 30s should have a clinical breast exam (CBE) by a caregiver as part of a regular health exam every 1 to 3 years. After age 40, women should have a CBE every year. Starting at age 40, women should consider having a mammography (breast X-ray test) every year. Women who have a family history of breast cancer should talk to their caregiver about genetic screening. Women at a high risk of breast cancer should talk to their caregivers about having magnetic resonance imaging (MRI) and a mammography every year.  The Pap test is a screening test for cervical cancer. A Pap test can show cell changes on the cervix that might become cervical cancer if left untreated. A Pap test is a procedure in which cells are obtained and examined from the lower end of the uterus (cervix).  Women should have a Pap test starting at age 21.  Between ages 21 and 29, Pap tests should be repeated every 2 years.  Beginning at age 30, you should have a Pap test every 3 years as long as the past 3 Pap tests have been normal.  Some women have medical problems that increase the chance of getting cervical cancer. Talk to your caregiver about these problems. It is especially important to talk to your caregiver if a new problem develops soon after your last Pap test. In these cases, your caregiver may recommend more frequent screening and Pap tests.  The above recommendations are the same for women who have or have not gotten the vaccine for human papillomavirus (HPV).  If you had a hysterectomy for a problem that was not cancer or a condition that could lead to cancer, then you no longer need Pap tests. Even if you no longer need a Pap test, a regular exam is a good idea to make sure no other problems are  starting.  If you are between ages 65 and 70, and you have had normal Pap tests going back 10 years, you no longer need Pap tests. Even if you no longer need a Pap test, a regular exam is a good idea to make sure no other problems are starting.  If you have had past treatment for cervical cancer or a condition that could lead to cancer, you need Pap tests and screening for cancer for at least 20 years after your treatment.  If Pap tests have been discontinued, risk factors (such as a new sexual partner) need to be reassessed to determine if screening should be resumed.  The HPV test is an additional test that may be used for cervical cancer screening. The HPV test looks for the virus that can cause the cell changes on the cervix. The cells collected during the Pap test can be tested for HPV. The HPV test could be used to screen women aged 30 years and older, and should   be used in women of any age who have unclear Pap test results. After the age of 30, women should have HPV testing at the same frequency as a Pap test.  Colorectal cancer can be detected and often prevented. Most routine colorectal cancer screening begins at the age of 50 and continues through age 75. However, your caregiver may recommend screening at an earlier age if you have risk factors for colon cancer. On a yearly basis, your caregiver may provide home test kits to check for hidden blood in the stool. Use of a small camera at the end of a tube, to directly examine the colon (sigmoidoscopy or colonoscopy), can detect the earliest forms of colorectal cancer. Talk to your caregiver about this at age 50, when routine screening begins. Direct examination of the colon should be repeated every 5 to 10 years through age 75, unless early forms of pre-cancerous polyps or small growths are found.  Hepatitis C blood testing is recommended for all people born from 1945 through 1965 and any individual with known risks for hepatitis C.  Practice  safe sex. Use condoms and avoid high-risk sexual practices to reduce the spread of sexually transmitted infections (STIs). STIs include gonorrhea, chlamydia, syphilis, trichomonas, herpes, HPV, and human immunodeficiency virus (HIV). Herpes, HIV, and HPV are viral illnesses that have no cure. They can result in disability, cancer, and death. Sexually active women aged 25 and younger should be checked for chlamydia. Older women with new or multiple partners should also be tested for chlamydia. Testing for other STIs is recommended if you are sexually active and at increased risk.  Osteoporosis is a disease in which the bones lose minerals and strength with aging. This can result in serious bone fractures. The risk of osteoporosis can be identified using a bone density scan. Women ages 65 and over and women at risk for fractures or osteoporosis should discuss screening with their caregivers. Ask your caregiver whether you should take a calcium supplement or vitamin D to reduce the rate of osteoporosis.  Menopause can be associated with physical symptoms and risks. Hormone replacement therapy is available to decrease symptoms and risks. You should talk to your caregiver about whether hormone replacement therapy is right for you.  Use sunscreen with sun protection factor (SPF) of 30 or more. Apply sunscreen liberally and repeatedly throughout the day. You should seek shade when your shadow is shorter than you. Protect yourself by wearing long sleeves, pants, a wide-brimmed hat, and sunglasses year round, whenever you are outdoors.  Once a month, do a whole body skin exam, using a mirror to look at the skin on your back. Notify your caregiver of new moles, moles that have irregular borders, moles that are larger than a pencil eraser, or moles that have changed in shape or color.  Stay current with required immunizations.  Influenza. You need a dose every fall (or winter). The composition of the flu vaccine  changes each year, so being vaccinated once is not enough.  Pneumococcal polysaccharide. You need 1 to 2 doses if you smoke cigarettes or if you have certain chronic medical conditions. You need 1 dose at age 65 (or older) if you have never been vaccinated.  Tetanus, diphtheria, pertussis (Tdap, Td). Get 1 dose of Tdap vaccine if you are younger than age 65, are over 65 and have contact with an infant, are a healthcare worker, are pregnant, or simply want to be protected from whooping cough. After that, you need a Td   booster dose every 10 years. Consult your caregiver if you have not had at least 3 tetanus and diphtheria-containing shots sometime in your life or have a deep or dirty wound.  HPV. You need this vaccine if you are a woman age 26 or younger. The vaccine is given in 3 doses over 6 months.  Measles, mumps, rubella (MMR). You need at least 1 dose of MMR if you were born in 1957 or later. You may also need a second dose.  Meningococcal. If you are age 19 to 21 and a first-year college student living in a residence hall, or have one of several medical conditions, you need to get vaccinated against meningococcal disease. You may also need additional booster doses.  Zoster (shingles). If you are age 60 or older, you should get this vaccine.  Varicella (chickenpox). If you have never had chickenpox or you were vaccinated but received only 1 dose, talk to your caregiver to find out if you need this vaccine.  Hepatitis A. You need this vaccine if you have a specific risk factor for hepatitis A virus infection or you simply wish to be protected from this disease. The vaccine is usually given as 2 doses, 6 to 18 months apart.  Hepatitis B. You need this vaccine if you have a specific risk factor for hepatitis B virus infection or you simply wish to be protected from this disease. The vaccine is given in 3 doses, usually over 6 months. Preventive Services / Frequency Ages 19 to 39  Blood  pressure check.** / Every 1 to 2 years.  Lipid and cholesterol check.** / Every 5 years beginning at age 20.  Clinical breast exam.** / Every 3 years for women in their 20s and 30s.  Pap test.** / Every 2 years from ages 21 through 29. Every 3 years starting at age 30 through age 65 or 70 with a history of 3 consecutive normal Pap tests.  HPV screening.** / Every 3 years from ages 30 through ages 65 to 70 with a history of 3 consecutive normal Pap tests.  Hepatitis C blood test.** / For any individual with known risks for hepatitis C.  Skin self-exam. / Monthly.  Influenza immunization.** / Every year.  Pneumococcal polysaccharide immunization.** / 1 to 2 doses if you smoke cigarettes or if you have certain chronic medical conditions.  Tetanus, diphtheria, pertussis (Tdap, Td) immunization. / A one-time dose of Tdap vaccine. After that, you need a Td booster dose every 10 years.  HPV immunization. / 3 doses over 6 months, if you are 26 and younger.  Measles, mumps, rubella (MMR) immunization. / You need at least 1 dose of MMR if you were born in 1957 or later. You may also need a second dose.  Meningococcal immunization. / 1 dose if you are age 19 to 21 and a first-year college student living in a residence hall, or have one of several medical conditions, you need to get vaccinated against meningococcal disease. You may also need additional booster doses.  Varicella immunization.** / Consult your caregiver.  Hepatitis A immunization.** / Consult your caregiver. 2 doses, 6 to 18 months apart.  Hepatitis B immunization.** / Consult your caregiver. 3 doses usually over 6 months. Ages 40 to 64  Blood pressure check.** / Every 1 to 2 years.  Lipid and cholesterol check.** / Every 5 years beginning at age 20.  Clinical breast exam.** / Every year after age 40.  Mammogram.** / Every year beginning at age 40   and continuing for as long as you are in good health. Consult with your  caregiver.  Pap test.** / Every 3 years starting at age 30 through age 65 or 70 with a history of 3 consecutive normal Pap tests.  HPV screening.** / Every 3 years from ages 30 through ages 65 to 70 with a history of 3 consecutive normal Pap tests.  Fecal occult blood test (FOBT) of stool. / Every year beginning at age 50 and continuing until age 75. You may not need to do this test if you get a colonoscopy every 10 years.  Flexible sigmoidoscopy or colonoscopy.** / Every 5 years for a flexible sigmoidoscopy or every 10 years for a colonoscopy beginning at age 50 and continuing until age 75.  Hepatitis C blood test.** / For all people born from 1945 through 1965 and any individual with known risks for hepatitis C.  Skin self-exam. / Monthly.  Influenza immunization.** / Every year.  Pneumococcal polysaccharide immunization.** / 1 to 2 doses if you smoke cigarettes or if you have certain chronic medical conditions.  Tetanus, diphtheria, pertussis (Tdap, Td) immunization.** / A one-time dose of Tdap vaccine. After that, you need a Td booster dose every 10 years.  Measles, mumps, rubella (MMR) immunization. / You need at least 1 dose of MMR if you were born in 1957 or later. You may also need a second dose.  Varicella immunization.** / Consult your caregiver.  Meningococcal immunization.** / Consult your caregiver.  Hepatitis A immunization.** / Consult your caregiver. 2 doses, 6 to 18 months apart.  Hepatitis B immunization.** / Consult your caregiver. 3 doses, usually over 6 months. Ages 65 and over  Blood pressure check.** / Every 1 to 2 years.  Lipid and cholesterol check.** / Every 5 years beginning at age 20.  Clinical breast exam.** / Every year after age 40.  Mammogram.** / Every year beginning at age 40 and continuing for as long as you are in good health. Consult with your caregiver.  Pap test.** / Every 3 years starting at age 30 through age 65 or 70 with a 3  consecutive normal Pap tests. Testing can be stopped between 65 and 70 with 3 consecutive normal Pap tests and no abnormal Pap or HPV tests in the past 10 years.  HPV screening.** / Every 3 years from ages 30 through ages 65 or 70 with a history of 3 consecutive normal Pap tests. Testing can be stopped between 65 and 70 with 3 consecutive normal Pap tests and no abnormal Pap or HPV tests in the past 10 years.  Fecal occult blood test (FOBT) of stool. / Every year beginning at age 50 and continuing until age 75. You may not need to do this test if you get a colonoscopy every 10 years.  Flexible sigmoidoscopy or colonoscopy.** / Every 5 years for a flexible sigmoidoscopy or every 10 years for a colonoscopy beginning at age 50 and continuing until age 75.  Hepatitis C blood test.** / For all people born from 1945 through 1965 and any individual with known risks for hepatitis C.  Osteoporosis screening.** / A one-time screening for women ages 65 and over and women at risk for fractures or osteoporosis.  Skin self-exam. / Monthly.  Influenza immunization.** / Every year.  Pneumococcal polysaccharide immunization.** / 1 dose at age 65 (or older) if you have never been vaccinated.  Tetanus, diphtheria, pertussis (Tdap, Td) immunization. / A one-time dose of Tdap vaccine if you are over   65 and have contact with an infant, are a healthcare worker, or simply want to be protected from whooping cough. After that, you need a Td booster dose every 10 years.  Varicella immunization.** / Consult your caregiver.  Meningococcal immunization.** / Consult your caregiver.  Hepatitis A immunization.** / Consult your caregiver. 2 doses, 6 to 18 months apart.  Hepatitis B immunization.** / Check with your caregiver. 3 doses, usually over 6 months. ** Family history and personal history of risk and conditions may change your caregiver's recommendations. Document Released: 09/13/2001 Document Revised: 10/10/2011  Document Reviewed: 12/13/2010 [employer] Patient Information 2013 [employer].  

## 2012-08-14 NOTE — Progress Notes (Signed)
Subjective:     Ann Maddox is a 52 y.o. female and is here for a comprehensive physical exam. The patient reports no problems.  History   Social History  . Marital Status: Married    Spouse Name: N/A    Number of Children: N/A  . Years of Education: N/A   Occupational History  . Not on file.   Social History Main Topics  . Smoking status: Never Smoker   . Smokeless tobacco: Never Used  . Alcohol Use: 1.8 oz/week    3 Glasses of wine per week     Comment: Red wine  . Drug Use: No  . Sexually Active: Yes -- Female partner(s)   Other Topics Concern  . Not on file   Social History Narrative  . No narrative on file   Health Maintenance  Topic Date Due  . Pap Smear  05/03/1979  . Tetanus/tdap  05/02/1980  . Influenza Vaccine  04/01/2012  . Mammogram  06/06/2014  . Colonoscopy  06/18/2017    The following portions of the patient's history were reviewed and updated as appropriate:  She  has a past medical history of Allergy. She  does not have any pertinent problems on file. 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 Her family history includes Cancer in her father; Diabetes in her father; Glaucoma in her mother; Heart disease in her father, paternal grandfather, and paternal uncle; Hyperlipidemia in her father and mother; and Hypertension in her father and mother.  There is no history of Colon cancer. She  reports that she has never smoked. She has never used smokeless tobacco. She reports that she drinks about 1.8 ounces of alcohol per week. She reports that she does not use illicit drugs. She has a current medication list which includes the following prescription(s): boswellia serrata, cetirizine, collagen hydrolysate, fluconazole, magnesium, multivitamin, omega-3 fatty acids, polyethylene glycol powder, probiotic product, UNABLE TO FIND,  cyclobenzaprine, and mometasone. Current Outpatient Prescriptions on File Prior to Visit  Medication Sig Dispense Refill  . Boswellia Serrata (BOSWELLIA PO) Take 2 capsules by mouth daily.      . cetirizine (ZYRTEC) 10 MG tablet Take 10 mg by mouth daily.        . Collagen Hydrolysate POWD Take 1 each by mouth daily.      . fluconazole (DIFLUCAN) 150 MG tablet Please take one tablet at onset of symptoms and repeat in one week if necessary.  2 tablet  1  . Magnesium 400 MG CAPS Take 2 capsules by mouth daily.      . Multiple Vitamin (MULTIVITAMIN) tablet Take 1 tablet by mouth daily.      . Omega-3 Fatty Acids (OMEGA 3 PO) Take 2 capsules by mouth daily.      . polyethylene glycol powder (MIRALAX) powder Take 17 g by mouth daily.      . Probiotic Product (PROBIOTIC DAILY PO) Take 3 capsules by mouth daily.      Marland Kitchen UNABLE TO FIND Take 2 tablets by mouth daily. Med Name: Ziflamend      . mometasone (NASONEX) 50 MCG/ACT nasal spray Place 2 sprays into the nose daily as needed.  17 g  11   She  has no known allergies..  Review of Systems Review of Systems  Constitutional: Negative for activity change, appetite change and fatigue.  HENT: Negative for hearing loss, congestion, tinnitus and ear  discharge.  dentist q12m Eyes: Negative for visual disturbance (see optho q1y -- vision corrected to 20/20 with glasses).  Respiratory: Negative for cough, chest tightness and shortness of breath.   Cardiovascular: Negative for chest pain, palpitations and leg swelling.  Gastrointestinal: Negative for abdominal pain, diarrhea, constipation and abdominal distention.  Genitourinary: Negative for urgency, frequency, decreased urine volume and difficulty urinating.  Musculoskeletal: Negative for back pain, arthralgias and gait problem.  Skin: Negative for color change, pallor and rash.  Neurological: Negative for dizziness, light-headedness, numbness and headaches.  Hematological: Negative for adenopathy. Does  not bruise/bleed easily.  Psychiatric/Behavioral: Negative for suicidal ideas, confusion, sleep disturbance, self-injury, dysphoric mood, decreased concentration and agitation.       Objective:    BP 140/72  Pulse 68  Temp 98.2 F (36.8 C) (Oral)  Ht 6' 0.25" (1.835 m)  Wt 179 lb 9.6 oz (81.466 kg)  BMI 24.19 kg/m2  SpO2 98% General appearance: alert, cooperative, appears stated age and no distress Head: Normocephalic, without obvious abnormality, atraumatic Eyes: conjunctivae/corneas clear. PERRL, EOM's intact. Fundi benign. Ears: normal TM's and external ear canals both ears Nose: Nares normal. Septum midline. Mucosa normal. No drainage or sinus tenderness. Throat: lips, mucosa, and tongue normal; teeth and gums normal Neck: no adenopathy, no carotid bruit, no JVD, supple, symmetrical, trachea midline and thyroid not enlarged, symmetric, no tenderness/mass/nodules Back: symmetric, no curvature. ROM normal. No CVA tenderness. Lungs: clear to auscultation bilaterally Breasts: normal appearance, no masses or tenderness Heart: regular rate and rhythm, S1, S2 normal, no murmur, click, rub or gallop Abdomen: soft, non-tender; bowel sounds normal; no masses,  no organomegaly Pelvic: cervix normal in appearance, external genitalia normal, no adnexal masses or tenderness, no cervical motion tenderness, rectovaginal septum normal, uterus normal size, shape, and consistency and vagina normal without discharge Extremities: extremities normal, atraumatic, no cyanosis or edema Pulses: 2+ and symmetric Skin: Skin color, texture, turgor normal. No rashes or lesions Lymph nodes: Cervical, supraclavicular, and axillary nodes normal. Neurologic: Alert and oriented X 3, normal strength and tone. Normal symmetric reflexes. Normal coordination and gait psych-- no anxiety, no depression    Assessment:    Healthy female exam.       Plan:    check labs ghm utd See After Visit Summary for  Counseling Recommendations

## 2012-08-20 ENCOUNTER — Encounter: Payer: Self-pay | Admitting: Family Medicine

## 2012-09-27 ENCOUNTER — Encounter: Payer: Self-pay | Admitting: Family Medicine

## 2012-10-29 ENCOUNTER — Other Ambulatory Visit: Payer: Self-pay | Admitting: Dermatology

## 2014-11-04 ENCOUNTER — Telehealth: Payer: Self-pay | Admitting: Family Medicine

## 2014-11-04 ENCOUNTER — Ambulatory Visit (INDEPENDENT_AMBULATORY_CARE_PROVIDER_SITE_OTHER): Payer: BLUE CROSS/BLUE SHIELD | Admitting: Family Medicine

## 2014-11-04 ENCOUNTER — Encounter: Payer: Self-pay | Admitting: Family Medicine

## 2014-11-04 VITALS — BP 133/85 | HR 74 | Temp 98.7°F | Wt 180.6 lb

## 2014-11-04 DIAGNOSIS — M25571 Pain in right ankle and joints of right foot: Secondary | ICD-10-CM

## 2014-11-04 DIAGNOSIS — M6740 Ganglion, unspecified site: Secondary | ICD-10-CM

## 2014-11-04 DIAGNOSIS — J020 Streptococcal pharyngitis: Secondary | ICD-10-CM

## 2014-11-04 DIAGNOSIS — J029 Acute pharyngitis, unspecified: Secondary | ICD-10-CM | POA: Diagnosis not present

## 2014-11-04 DIAGNOSIS — H6500 Acute serous otitis media, unspecified ear: Secondary | ICD-10-CM

## 2014-11-04 LAB — POCT RAPID STREP A (OFFICE): Rapid Strep A Screen: POSITIVE — AB

## 2014-11-04 MED ORDER — PREDNISONE 10 MG PO TABS: ORAL_TABLET | ORAL | Status: DC

## 2014-11-04 MED ORDER — PENICILLIN G BENZATHINE 1200000 UNIT/2ML IM SUSP
1.2000 10*6.[IU] | Freq: Once | INTRAMUSCULAR | Status: AC
  Administered 2014-11-04: 1.2 10*6.[IU] via INTRAMUSCULAR

## 2014-11-04 MED ORDER — PREDNISONE 10 MG PO TABS
ORAL_TABLET | ORAL | Status: DC
Start: 2014-11-04 — End: 2014-11-04

## 2014-11-04 NOTE — Telephone Encounter (Signed)
Caller name: Taquisha Relation to JT:TSVX Call back [phone]  Pharmacy:[facility] at [address] [address]  Reason for call: Pt called stating that wanted rx predniSONE (DELTASONE) 10 MG tablet  sent to [facility] at [address] and not to [facility] [place], [place]. Please advise.

## 2014-11-04 NOTE — Progress Notes (Signed)
Pre visit review using our clinic review tool, if applicable. No additional management support is needed unless otherwise documented below in the visit note. 

## 2014-11-04 NOTE — Patient Instructions (Signed)

## 2014-11-04 NOTE — Telephone Encounter (Signed)
Rx cancelled at [facility] and sent to [facility] as requested.    KP

## 2014-11-04 NOTE — Progress Notes (Signed)
  Subjective:     Ann Maddox is a 54 y.o. female who presents for evaluation of sore throat. Associated symptoms include dry cough, enlarged tonsils, nasal blockage, post nasal drip, sinus and nasal congestion and sore throat. Onset of symptoms was 10 days ago, and have been gradually worsening since that time. She is drinking plenty of fluids. She has not had a recent close exposure to someone with proven streptococcal pharyngitis.  The following portions of the patient's history were reviewed and updated as appropriate:  She  has a past medical history of Allergy. She  does not have any pertinent problems on file. 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 Her family history includes Cancer in her father; Diabetes in her father; Glaucoma in her mother; Heart disease in her father, paternal grandfather, and paternal uncle; Hyperlipidemia in her father and mother; Hypertension in her father and mother. There is no history of Colon cancer. She  reports that she has never smoked. She has never used smokeless tobacco. She reports that she drinks about 1.8 oz of alcohol per week. She reports that she does not use illicit drugs. She has a current medication list which includes the following prescription(s): boswellia serrata, cetirizine, collagen hydrolysate, fluconazole, magnesium, mometasone, multivitamin, omega-3 fatty acids, polyethylene glycol powder, probiotic product, UNABLE TO FIND, and prednisone. Current Outpatient Prescriptions on File Prior to Visit  Medication Sig Dispense Refill  . Boswellia Serrata (BOSWELLIA PO) Take 2 capsules by mouth daily.    . cetirizine (ZYRTEC) 10 MG tablet Take 10 mg by mouth daily.      . Collagen Hydrolysate POWD Take 1 each by mouth daily.    . fluconazole (DIFLUCAN) 150 MG tablet Please take one tablet at onset of symptoms and  repeat in one week if necessary. 2 tablet 1  . Magnesium 400 MG CAPS Take 2 capsules by mouth daily.    . mometasone (NASONEX) 50 MCG/ACT nasal spray Place 2 sprays into the nose daily as needed. 17 g 11  . Multiple Vitamin (MULTIVITAMIN) tablet Take 1 tablet by mouth daily.    . Omega-3 Fatty Acids (OMEGA 3 PO) Take 2 capsules by mouth daily.    . polyethylene glycol powder (MIRALAX) powder Take 17 g by mouth daily.    . Probiotic Product (PROBIOTIC DAILY PO) Take 3 capsules by mouth daily.    Marland Kitchen UNABLE TO FIND Take 2 tablets by mouth daily. Med Name: Ziflamend     No current facility-administered medications on file prior to visit.   She has No Known Allergies..  Review of Systems Pertinent items are noted in HPI.    Objective:    BP 133/85 mmHg  Pulse 74  Temp(Src) 98.7 F (37.1 C) (Oral)  Wt 180 lb 9.6 oz (81.92 kg)  SpO2 100% General appearance: alert, cooperative, appears stated age and no distress Ears: normal TM's and external ear canals both ears Nose: green discharge, mild congestion, turbinates red, swollen Throat: abnormal findings: moderate oropharyngeal erythema and +pnd Neck: moderate anterior cervical adenopathy, no JVD and thyroid not enlarged, symmetric, no tenderness/mass/nodules Lungs: clear to auscultation bilaterally Heart: S1, S2 normal  Laboratory Strep test done. Results:positive.    Assessment:    Acute pharyngitis, likely  Strep throat.    Plan:    Patient placed on antibiotics. Follow up as needed.   Bicillin 1.2 m u IM

## 2014-11-07 ENCOUNTER — Telehealth: Payer: Self-pay | Admitting: Family Medicine

## 2014-11-07 MED ORDER — FLUCONAZOLE 150 MG PO TABS: ORAL_TABLET | ORAL | Status: DC

## 2014-11-07 NOTE — Telephone Encounter (Signed)
Diflucan 150 mg #2  1 po qd x 1, may repeat in 3 days prn  

## 2014-11-07 NOTE — Telephone Encounter (Signed)
Please advise 

## 2014-11-07 NOTE — Telephone Encounter (Signed)
Caller name: Jenissa, Tyrell Relation to pt: self  Call back number: [phone] Pharmacy: [id number] - [place], [place] [place] [phone] (Phone) [phone] (Fax)         Reason for call:  Pt states prednisone shot given by MD caused her to have a yeast infection requesting diflucan 2 RX. Pt states her symptoms are odor and color change

## 2014-11-07 NOTE — Telephone Encounter (Signed)
Rx sent to [facility]

## 2014-11-13 ENCOUNTER — Telehealth: Payer: Self-pay | Admitting: Family Medicine

## 2014-11-13 MED ORDER — AZITHROMYCIN 250 MG PO TABS: ORAL_TABLET | ORAL | Status: DC

## 2014-11-13 MED ORDER — FLUCONAZOLE 150 MG PO TABS: 150.0000 mg | ORAL_TABLET | Freq: Once | ORAL | Status: DC

## 2014-11-13 NOTE — Telephone Encounter (Signed)
Send a Z-Pak Send Diflucan 150 mg 1 tablet daily #2 no refills Office visit if not better

## 2014-11-13 NOTE — Telephone Encounter (Signed)
Patient has been made aware and verbalized understanding, both med's have been sent.     KP

## 2014-11-13 NOTE — Telephone Encounter (Signed)
Caller name: Shuntay Relation to pt: self Call back number: [phone] Pharmacy: [facility] [address]  Reason for call:   Patient states that she was seen last week with Strep and was given an injection. She states that her throat is hurting again and would like an antibiotic called in. Also, if we do send in an antibiotic patient is requesting an rx for diflucan.

## 2014-11-13 NOTE — Telephone Encounter (Signed)
Patient was see 11/04/14 and given Bicillin in the office, she is still having strep symptoms/sore throat and would like an Antibiotic called to the pharmacy. Please advise     KP

## 2014-11-24 ENCOUNTER — Telehealth: Payer: Self-pay | Admitting: Family Medicine

## 2014-11-24 NOTE — Telephone Encounter (Signed)
Pre visit letter fro annual exam mailed

## 2014-12-15 ENCOUNTER — Other Ambulatory Visit (HOSPITAL_COMMUNITY)
Admission: RE | Admit: 2014-12-15 | Discharge: 2014-12-15 | Disposition: A | Discharge status: Home / Self Care | Payer: BLUE CROSS/BLUE SHIELD | Source: Ambulatory Visit | Attending: Family Medicine | Admitting: Family Medicine

## 2014-12-15 ENCOUNTER — Encounter: Payer: Self-pay | Admitting: Family Medicine

## 2014-12-15 ENCOUNTER — Ambulatory Visit (INDEPENDENT_AMBULATORY_CARE_PROVIDER_SITE_OTHER): Payer: BLUE CROSS/BLUE SHIELD | Admitting: Family Medicine

## 2014-12-15 VITALS — BP 136/84 | HR 79 | Temp 98.3°F | Resp 16 | Ht 72.0 in | Wt 177.0 lb

## 2014-12-15 DIAGNOSIS — Z01419 Encounter for gynecological examination (general) (routine) without abnormal findings: Secondary | ICD-10-CM | POA: Insufficient documentation

## 2014-12-15 DIAGNOSIS — N951 Menopausal and female climacteric states: Secondary | ICD-10-CM

## 2014-12-15 DIAGNOSIS — Z0000 Encounter for general adult medical examination without abnormal findings: Secondary | ICD-10-CM | POA: Diagnosis not present

## 2014-12-15 LAB — BASIC METABOLIC PANEL
BUN: 12 mg/dL (ref 6–23)
CO2: 31 meq/L (ref 19–32)
Calcium: 9.3 mg/dL (ref 8.4–10.5)
Chloride: 101 mEq/L (ref 96–112)
Creatinine, Ser: 0.91 mg/dL (ref 0.40–1.20)
GFR: 68.57 mL/min (ref >60.00)
GLUCOSE: 95 mg/dL (ref 70–99)
Potassium: 3.3 mEq/L — ABNORMAL LOW (ref 3.5–5.1)
SODIUM: 137 meq/L (ref 135–145)

## 2014-12-15 LAB — TSH: TSH: 1.36 u[IU]/mL (ref 0.35–4.50)

## 2014-12-15 LAB — CBC WITH DIFFERENTIAL/PLATELET
Basophils Absolute: 0 10*3/uL (ref 0.0–0.1)
Basophils Relative: 0.4 % (ref 0.0–3.0)
EOS PCT: 0.3 % (ref 0.0–5.0)
Eosinophils Absolute: 0 10*3/uL (ref 0.0–0.7)
HCT: 41.5 % (ref 36.0–46.0)
Hemoglobin: 14.2 g/dL (ref 12.0–15.0)
Lymphocytes Relative: 35.6 % (ref 12.0–46.0)
Lymphs Abs: 2.3 10*3/uL (ref 0.7–4.0)
MCHC: 34.2 g/dL (ref 30.0–36.0)
MCV: 89 fl (ref 78.0–100.0)
MONOS PCT: 3.7 % (ref 3.0–12.0)
Monocytes Absolute: 0.2 10*3/uL (ref 0.1–1.0)
NEUTROS PCT: 60 % (ref 43.0–77.0)
Neutro Abs: 4 10*3/uL (ref 1.4–7.7)
PLATELETS: 228 10*3/uL (ref 150.0–400.0)
RBC: 4.66 Mil/uL (ref 3.87–5.11)
RDW: 13.3 % (ref 11.5–15.5)
WBC: 6.6 10*3/uL (ref 4.0–10.5)

## 2014-12-15 LAB — LIPID PANEL
CHOLESTEROL: 170 mg/dL (ref 0–200)
HDL: 67.9 mg/dL (ref >39.00)
LDL Cholesterol: 83 mg/dL (ref 0–99)
NonHDL: 102.1
Total CHOL/HDL Ratio: 3
Triglycerides: 96 mg/dL (ref 0.0–149.0)
VLDL: 19.2 mg/dL (ref 0.0–40.0)

## 2014-12-15 LAB — POCT URINALYSIS DIPSTICK
BILIRUBIN UA: NEGATIVE
Blood, UA: NEGATIVE
GLUCOSE UA: NEGATIVE
KETONES UA: NEGATIVE
LEUKOCYTES UA: NEGATIVE
Nitrite, UA: NEGATIVE
Protein, UA: NEGATIVE
SPEC GRAV UA: 1.02
Urobilinogen, UA: NEGATIVE
pH, UA: 6

## 2014-12-15 LAB — HEPATIC FUNCTION PANEL
ALT: 12 U/L (ref 0–35)
AST: 16 U/L (ref 0–37)
Albumin: 4.4 g/dL (ref 3.5–5.2)
Alkaline Phosphatase: 32 U/L — ABNORMAL LOW (ref 39–117)
BILIRUBIN DIRECT: 0.1 mg/dL (ref 0.0–0.3)
BILIRUBIN TOTAL: 0.5 mg/dL (ref 0.2–1.2)
Total Protein: 7.1 g/dL (ref 6.0–8.3)

## 2014-12-15 LAB — FOLLICLE STIMULATING HORMONE: FSH: 89 m[IU]/mL

## 2014-12-15 NOTE — Patient Instructions (Signed)
Preventive Care for Adults A healthy lifestyle and preventive care can promote health and wellness. Preventive health guidelines for women include the following key practices.  A routine yearly physical is a good way to check with your health care provider about your health and preventive screening. It is a chance to share any concerns and updates on your health and to receive a thorough exam.  Visit your dentist for a routine exam and preventive care every 6 months. Brush your teeth twice a day and floss once a day. Good oral hygiene prevents tooth decay and gum disease.  The frequency of eye exams is based on your age, health, family medical history, use of contact lenses, and other factors. Follow your health care provider's recommendations for frequency of eye exams.  Eat a healthy diet. Foods like vegetables, fruits, whole grains, low-fat dairy products, and lean protein foods contain the nutrients you need without too many calories. Decrease your intake of foods high in solid fats, added sugars, and salt. Eat the right amount of calories for you.Get information about a proper diet from your health care provider, if necessary.  Regular physical exercise is one of the most important things you can do for your health. Most adults should get at least 150 minutes of moderate-intensity exercise (any activity that increases your heart rate and causes you to sweat) each week. In addition, most adults need muscle-strengthening exercises on 2 or more days a week.  Maintain a healthy weight. The body mass index (BMI) is a screening tool to identify possible weight problems. It provides an estimate of body fat based on height and weight. Your health care provider can find your BMI and can help you achieve or maintain a healthy weight.For adults 20 years and older:  A BMI below 18.5 is considered underweight.  A BMI of 18.5 to 24.9 is normal.  A BMI of 25 to 29.9 is considered overweight.  A BMI of  30 and above is considered obese.  Maintain normal blood lipids and cholesterol levels by exercising and minimizing your intake of saturated fat. Eat a balanced diet with plenty of fruit and vegetables. Blood tests for lipids and cholesterol should begin at age 76 and be repeated every 5 years. If your lipid or cholesterol levels are high, you are over 50, or you are at high risk for heart disease, you may need your cholesterol levels checked more frequently.Ongoing high lipid and cholesterol levels should be treated with medicines if diet and exercise are not working.  If you smoke, find out from your health care provider how to quit. If you do not use tobacco, do not start.  Lung cancer screening is recommended for adults aged 22-80 years who are at high risk for developing lung cancer because of a history of smoking. A yearly low-dose CT scan of the lungs is recommended for people who have at least a 30-pack-year history of smoking and are a current smoker or have quit within the past 15 years. A pack year of smoking is smoking an average of 1 pack of cigarettes a day for 1 year (for example: 1 pack a day for 30 years or 2 packs a day for 15 years). Yearly screening should continue until the smoker has stopped smoking for at least 15 years. Yearly screening should be stopped for people who develop a health problem that would prevent them from having lung cancer treatment.  If you are pregnant, do not drink alcohol. If you are breastfeeding,  be very cautious about drinking alcohol. If you are not pregnant and choose to drink alcohol, do not have more than 1 drink per day. One drink is considered to be 12 ounces (355 mL) of beer, 5 ounces (148 mL) of wine, or 1.5 ounces (44 mL) of liquor.  Avoid use of street drugs. Do not share needles with anyone. Ask for help if you need support or instructions about stopping the use of drugs.  High blood pressure causes heart disease and increases the risk of  stroke. Your blood pressure should be checked at least every 1 to 2 years. Ongoing high blood pressure should be treated with medicines if weight loss and exercise do not work.  If you are 75-52 years old, ask your health care provider if you should take aspirin to prevent strokes.  Diabetes screening involves taking a blood sample to check your fasting blood sugar level. This should be done once every 3 years, after age 15, if you are within normal weight and without risk factors for diabetes. Testing should be considered at a younger age or be carried out more frequently if you are overweight and have at least 1 risk factor for diabetes.  Breast cancer screening is essential preventive care for women. You should practice "breast self-awareness." This means understanding the normal appearance and feel of your breasts and may include breast self-examination. Any changes detected, no matter how small, should be reported to a health care provider. Women in their 58s and 30s should have a clinical breast exam (CBE) by a health care provider as part of a regular health exam every 1 to 3 years. After age 16, women should have a CBE every year. Starting at age 53, women should consider having a mammogram (breast X-ray test) every year. Women who have a family history of breast cancer should talk to their health care provider about genetic screening. Women at a high risk of breast cancer should talk to their health care providers about having an MRI and a mammogram every year.  Breast cancer gene (BRCA)-related cancer risk assessment is recommended for women who have family members with BRCA-related cancers. BRCA-related cancers include breast, ovarian, tubal, and peritoneal cancers. Having family members with these cancers may be associated with an increased risk for harmful changes (mutations) in the breast cancer genes BRCA1 and BRCA2. Results of the assessment will determine the need for genetic counseling and  BRCA1 and BRCA2 testing.  Routine pelvic exams to screen for cancer are no longer recommended for nonpregnant women who are considered low risk for cancer of the pelvic organs (ovaries, uterus, and vagina) and who do not have symptoms. Ask your health care provider if a screening pelvic exam is right for you.  If you have had past treatment for cervical cancer or a condition that could lead to cancer, you need Pap tests and screening for cancer for at least 20 years after your treatment. If Pap tests have been discontinued, your risk factors (such as having a new sexual partner) need to be reassessed to determine if screening should be resumed. Some women have medical problems that increase the chance of getting cervical cancer. In these cases, your health care provider may recommend more frequent screening and Pap tests.  The HPV test is an additional test that may be used for cervical cancer screening. The HPV test looks for the virus that can cause the cell changes on the cervix. The cells collected during the Pap test can be  tested for HPV. The HPV test could be used to screen women aged 30 years and older, and should be used in women of any age who have unclear Pap test results. After the age of 30, women should have HPV testing at the same frequency as a Pap test.  Colorectal cancer can be detected and often prevented. Most routine colorectal cancer screening begins at the age of 50 years and continues through age 75 years. However, your health care provider may recommend screening at an earlier age if you have risk factors for colon cancer. On a yearly basis, your health care provider may provide home test kits to check for hidden blood in the stool. Use of a small camera at the end of a tube, to directly examine the colon (sigmoidoscopy or colonoscopy), can detect the earliest forms of colorectal cancer. Talk to your health care provider about this at age 50, when routine screening begins. Direct  exam of the colon should be repeated every 5-10 years through age 75 years, unless early forms of pre-cancerous polyps or small growths are found.  People who are at an increased risk for hepatitis B should be screened for this virus. You are considered at high risk for hepatitis B if:  You were born in a country where hepatitis B occurs often. Talk with your health care provider about which countries are considered high risk.  Your parents were born in a high-risk country and you have not received a shot to protect against hepatitis B (hepatitis B vaccine).  You have HIV or AIDS.  You use needles to inject street drugs.  You live with, or have sex with, someone who has hepatitis B.  You get hemodialysis treatment.  You take certain medicines for conditions like cancer, organ transplantation, and autoimmune conditions.  Hepatitis C blood testing is recommended for all people born from 1945 through 1965 and any individual with known risks for hepatitis C.  Practice safe sex. Use condoms and avoid high-risk sexual practices to reduce the spread of sexually transmitted infections (STIs). STIs include gonorrhea, chlamydia, syphilis, trichomonas, herpes, HPV, and human immunodeficiency virus (HIV). Herpes, HIV, and HPV are viral illnesses that have no cure. They can result in disability, cancer, and death.  You should be screened for sexually transmitted illnesses (STIs) including gonorrhea and chlamydia if:  You are sexually active and are younger than 24 years.  You are older than 24 years and your health care provider tells you that you are at risk for this type of infection.  Your sexual activity has changed since you were last screened and you are at an increased risk for chlamydia or gonorrhea. Ask your health care provider if you are at risk.  If you are at risk of being infected with HIV, it is recommended that you take a prescription medicine daily to prevent HIV infection. This is  called preexposure prophylaxis (PrEP). You are considered at risk if:  You are a heterosexual woman, are sexually active, and are at increased risk for HIV infection.  You take drugs by injection.  You are sexually active with a partner who has HIV.  Talk with your health care provider about whether you are at high risk of being infected with HIV. If you choose to begin PrEP, you should first be tested for HIV. You should then be tested every 3 months for as long as you are taking PrEP.  Osteoporosis is a disease in which the bones lose minerals and strength   with aging. This can result in serious bone fractures or breaks. The risk of osteoporosis can be identified using a bone density scan. Women ages 65 years and over and women at risk for fractures or osteoporosis should discuss screening with their health care providers. Ask your health care provider whether you should take a calcium supplement or vitamin D to reduce the rate of osteoporosis.  Menopause can be associated with physical symptoms and risks. Hormone replacement therapy is available to decrease symptoms and risks. You should talk to your health care provider about whether hormone replacement therapy is right for you.  Use sunscreen. Apply sunscreen liberally and repeatedly throughout the day. You should seek shade when your shadow is shorter than you. Protect yourself by wearing long sleeves, pants, a wide-brimmed hat, and sunglasses year round, whenever you are outdoors.  Once a month, do a whole body skin exam, using a mirror to look at the skin on your back. Tell your health care provider of new moles, moles that have irregular borders, moles that are larger than a pencil eraser, or moles that have changed in shape or color.  Stay current with required vaccines (immunizations).  Influenza vaccine. All adults should be immunized every year.  Tetanus, diphtheria, and acellular pertussis (Td, Tdap) vaccine. Pregnant women should  receive 1 dose of Tdap vaccine during each pregnancy. The dose should be obtained regardless of the length of time since the last dose. Immunization is preferred during the 27th-36th week of gestation. An adult who has not previously received Tdap or who does not know her vaccine status should receive 1 dose of Tdap. This initial dose should be followed by tetanus and diphtheria toxoids (Td) booster doses every 10 years. Adults with an unknown or incomplete history of completing a 3-dose immunization series with Td-containing vaccines should begin or complete a primary immunization series including a Tdap dose. Adults should receive a Td booster every 10 years.  Varicella vaccine. An adult without evidence of immunity to varicella should receive 2 doses or a second dose if she has previously received 1 dose. Pregnant females who do not have evidence of immunity should receive the first dose after pregnancy. This first dose should be obtained before leaving the health care facility. The second dose should be obtained 4-8 weeks after the first dose.  Human papillomavirus (HPV) vaccine. Females aged 13-26 years who have not received the vaccine previously should obtain the 3-dose series. The vaccine is not recommended for use in pregnant females. However, pregnancy testing is not needed before receiving a dose. If a female is found to be pregnant after receiving a dose, no treatment is needed. In that case, the remaining doses should be delayed until after the pregnancy. Immunization is recommended for any person with an immunocompromised condition through the age of 26 years if she did not get any or all doses earlier. During the 3-dose series, the second dose should be obtained 4-8 weeks after the first dose. The third dose should be obtained 24 weeks after the first dose and 16 weeks after the second dose.  Zoster vaccine. One dose is recommended for adults aged 60 years or older unless certain conditions are  present.  Measles, mumps, and rubella (MMR) vaccine. Adults born before 1957 generally are considered immune to measles and mumps. Adults born in 1957 or later should have 1 or more doses of MMR vaccine unless there is a contraindication to the vaccine or there is laboratory evidence of immunity to   each of the three diseases. A routine second dose of MMR vaccine should be obtained at least 28 days after the first dose for students attending postsecondary schools, health care workers, or international travelers. People who received inactivated measles vaccine or an unknown type of measles vaccine during 1963-1967 should receive 2 doses of MMR vaccine. People who received inactivated mumps vaccine or an unknown type of mumps vaccine before 1979 and are at high risk for mumps infection should consider immunization with 2 doses of MMR vaccine. For females of childbearing age, rubella immunity should be determined. If there is no evidence of immunity, females who are not pregnant should be vaccinated. If there is no evidence of immunity, females who are pregnant should delay immunization until after pregnancy. Unvaccinated health care workers born before 1957 who lack laboratory evidence of measles, mumps, or rubella immunity or laboratory confirmation of disease should consider measles and mumps immunization with 2 doses of MMR vaccine or rubella immunization with 1 dose of MMR vaccine.  Pneumococcal 13-valent conjugate (PCV13) vaccine. When indicated, a person who is uncertain of her immunization history and has no record of immunization should receive the PCV13 vaccine. An adult aged 19 years or older who has certain medical conditions and has not been previously immunized should receive 1 dose of PCV13 vaccine. This PCV13 should be followed with a dose of pneumococcal polysaccharide (PPSV23) vaccine. The PPSV23 vaccine dose should be obtained at least 8 weeks after the dose of PCV13 vaccine. An adult aged 19  years or older who has certain medical conditions and previously received 1 or more doses of PPSV23 vaccine should receive 1 dose of PCV13. The PCV13 vaccine dose should be obtained 1 or more years after the last PPSV23 vaccine dose.  Pneumococcal polysaccharide (PPSV23) vaccine. When PCV13 is also indicated, PCV13 should be obtained first. All adults aged 65 years and older should be immunized. An adult younger than age 65 years who has certain medical conditions should be immunized. Any person who resides in a nursing home or long-term care facility should be immunized. An adult smoker should be immunized. People with an immunocompromised condition and certain other conditions should receive both PCV13 and PPSV23 vaccines. People with human immunodeficiency virus (HIV) infection should be immunized as soon as possible after diagnosis. Immunization during chemotherapy or radiation therapy should be avoided. Routine use of PPSV23 vaccine is not recommended for American Indians, [place] Natives, or people younger than 65 years unless there are medical conditions that require PPSV23 vaccine. When indicated, people who have unknown immunization and have no record of immunization should receive PPSV23 vaccine. One-time revaccination 5 years after the first dose of PPSV23 is recommended for people aged 19-64 years who have chronic kidney failure, nephrotic syndrome, asplenia, or immunocompromised conditions. People who received 1-2 doses of PPSV23 before age 65 years should receive another dose of PPSV23 vaccine at age 65 years or later if at least 5 years have passed since the previous dose. Doses of PPSV23 are not needed for people immunized with PPSV23 at or after age 65 years.  Meningococcal vaccine. Adults with asplenia or persistent complement component deficiencies should receive 2 doses of quadrivalent meningococcal conjugate (MenACWY-D) vaccine. The doses should be obtained at least 2 months apart.  Microbiologists working with certain meningococcal bacteria, military recruits, people at risk during an outbreak, and people who travel to or live in countries with a high rate of meningitis should be immunized. A first-year college student up through age   21 years who is living in a residence hall should receive a dose if she did not receive a dose on or after her 16th birthday. Adults who have certain high-risk conditions should receive one or more doses of vaccine.  Hepatitis A vaccine. Adults who wish to be protected from this disease, have certain high-risk conditions, work with hepatitis A-infected animals, work in hepatitis A research labs, or travel to or work in countries with a high rate of hepatitis A should be immunized. Adults who were previously unvaccinated and who anticipate close contact with an international adoptee during the first 60 days after arrival in the [place] from a country with a high rate of hepatitis A should be immunized.  Hepatitis B vaccine. Adults who wish to be protected from this disease, have certain high-risk conditions, may be exposed to blood or other infectious body fluids, are household contacts or sex partners of hepatitis B positive people, are clients or workers in certain care facilities, or travel to or work in countries with a high rate of hepatitis B should be immunized.  Haemophilus influenzae type b (Hib) vaccine. A previously unvaccinated person with asplenia or sickle cell disease or having a scheduled splenectomy should receive 1 dose of Hib vaccine. Regardless of previous immunization, a recipient of a hematopoietic stem cell transplant should receive a 3-dose series 6-12 months after her successful transplant. Hib vaccine is not recommended for adults with HIV infection. Preventive Services / Frequency Ages 64 to 68 years  Blood pressure check.** / Every 1 to 2 years.  Lipid and cholesterol check.** / Every 5 years beginning at age  22.  Clinical breast exam.** / Every 3 years for women in their 88s and 53s.  BRCA-related cancer risk assessment.** / For women who have family members with a BRCA-related cancer (breast, ovarian, tubal, or peritoneal cancers).  Pap test.** / Every 2 years from ages 90 through 51. Every 3 years starting at age 21 through age 56 or 3 with a history of 3 consecutive normal Pap tests.  HPV screening.** / Every 3 years from ages 24 through ages 1 to 46 with a history of 3 consecutive normal Pap tests.  Hepatitis C blood test.** / For any individual with known risks for hepatitis C.  Skin self-exam. / Monthly.  Influenza vaccine. / Every year.  Tetanus, diphtheria, and acellular pertussis (Tdap, Td) vaccine.** / Consult your health care provider. Pregnant women should receive 1 dose of Tdap vaccine during each pregnancy. 1 dose of Td every 10 years.  Varicella vaccine.** / Consult your health care provider. Pregnant females who do not have evidence of immunity should receive the first dose after pregnancy.  HPV vaccine. / 3 doses over 6 months, if 72 and younger. The vaccine is not recommended for use in pregnant females. However, pregnancy testing is not needed before receiving a dose.  Measles, mumps, rubella (MMR) vaccine.** / You need at least 1 dose of MMR if you were born in 1957 or later. You may also need a 2nd dose. For females of childbearing age, rubella immunity should be determined. If there is no evidence of immunity, females who are not pregnant should be vaccinated. If there is no evidence of immunity, females who are pregnant should delay immunization until after pregnancy.  Pneumococcal 13-valent conjugate (PCV13) vaccine.** / Consult your health care provider.  Pneumococcal polysaccharide (PPSV23) vaccine.** / 1 to 2 doses if you smoke cigarettes or if you have certain conditions.  Meningococcal vaccine.** /  1 dose if you are age 19 to 21 years and a first-year college  student living in a residence hall, or have one of several medical conditions, you need to get vaccinated against meningococcal disease. You may also need additional booster doses.  Hepatitis A vaccine.** / Consult your health care provider.  Hepatitis B vaccine.** / Consult your health care provider.  Haemophilus influenzae type b (Hib) vaccine.** / Consult your health care provider. Ages 40 to 64 years  Blood pressure check.** / Every 1 to 2 years.  Lipid and cholesterol check.** / Every 5 years beginning at age 20 years.  Lung cancer screening. / Every year if you are aged 55-80 years and have a 30-pack-year history of smoking and currently smoke or have quit within the past 15 years. Yearly screening is stopped once you have quit smoking for at least 15 years or develop a health problem that would prevent you from having lung cancer treatment.  Clinical breast exam.** / Every year after age 40 years.  BRCA-related cancer risk assessment.** / For women who have family members with a BRCA-related cancer (breast, ovarian, tubal, or peritoneal cancers).  Mammogram.** / Every year beginning at age 40 years and continuing for as long as you are in good health. Consult with your health care provider.  Pap test.** / Every 3 years starting at age 30 years through age 65 or 70 years with a history of 3 consecutive normal Pap tests.  HPV screening.** / Every 3 years from ages 30 years through ages 65 to 70 years with a history of 3 consecutive normal Pap tests.  Fecal occult blood test (FOBT) of stool. / Every year beginning at age 50 years and continuing until age 75 years. You may not need to do this test if you get a colonoscopy every 10 years.  Flexible sigmoidoscopy or colonoscopy.** / Every 5 years for a flexible sigmoidoscopy or every 10 years for a colonoscopy beginning at age 50 years and continuing until age 75 years.  Hepatitis C blood test.** / For all people born from 1945 through  1965 and any individual with known risks for hepatitis C.  Skin self-exam. / Monthly.  Influenza vaccine. / Every year.  Tetanus, diphtheria, and acellular pertussis (Tdap/Td) vaccine.** / Consult your health care provider. Pregnant women should receive 1 dose of Tdap vaccine during each pregnancy. 1 dose of Td every 10 years.  Varicella vaccine.** / Consult your health care provider. Pregnant females who do not have evidence of immunity should receive the first dose after pregnancy.  Zoster vaccine.** / 1 dose for adults aged 60 years or older.  Measles, mumps, rubella (MMR) vaccine.** / You need at least 1 dose of MMR if you were born in 1957 or later. You may also need a 2nd dose. For females of childbearing age, rubella immunity should be determined. If there is no evidence of immunity, females who are not pregnant should be vaccinated. If there is no evidence of immunity, females who are pregnant should delay immunization until after pregnancy.  Pneumococcal 13-valent conjugate (PCV13) vaccine.** / Consult your health care provider.  Pneumococcal polysaccharide (PPSV23) vaccine.** / 1 to 2 doses if you smoke cigarettes or if you have certain conditions.  Meningococcal vaccine.** / Consult your health care provider.  Hepatitis A vaccine.** / Consult your health care provider.  Hepatitis B vaccine.** / Consult your health care provider.  Haemophilus influenzae type b (Hib) vaccine.** / Consult your health care provider. Ages 65   years and over  Blood pressure check.** / Every 1 to 2 years.  Lipid and cholesterol check.** / Every 5 years beginning at age 22 years.  Lung cancer screening. / Every year if you are aged 73-80 years and have a 30-pack-year history of smoking and currently smoke or have quit within the past 15 years. Yearly screening is stopped once you have quit smoking for at least 15 years or develop a health problem that would prevent you from having lung cancer  treatment.  Clinical breast exam.** / Every year after age 4 years.  BRCA-related cancer risk assessment.** / For women who have family members with a BRCA-related cancer (breast, ovarian, tubal, or peritoneal cancers).  Mammogram.** / Every year beginning at age 40 years and continuing for as long as you are in good health. Consult with your health care provider.  Pap test.** / Every 3 years starting at age 9 years through age 34 or 91 years with 3 consecutive normal Pap tests. Testing can be stopped between 65 and 70 years with 3 consecutive normal Pap tests and no abnormal Pap or HPV tests in the past 10 years.  HPV screening.** / Every 3 years from ages 57 years through ages 64 or 45 years with a history of 3 consecutive normal Pap tests. Testing can be stopped between 65 and 70 years with 3 consecutive normal Pap tests and no abnormal Pap or HPV tests in the past 10 years.  Fecal occult blood test (FOBT) of stool. / Every year beginning at age 15 years and continuing until age 17 years. You may not need to do this test if you get a colonoscopy every 10 years.  Flexible sigmoidoscopy or colonoscopy.** / Every 5 years for a flexible sigmoidoscopy or every 10 years for a colonoscopy beginning at age 86 years and continuing until age 71 years.  Hepatitis C blood test.** / For all people born from [birth year] through 1965 and any individual with known risks for hepatitis C.  Osteoporosis screening.** / A one-time screening for women ages 83 years and over and women at risk for fractures or osteoporosis.  Skin self-exam. / Monthly.  Influenza vaccine. / Every year.  Tetanus, diphtheria, and acellular pertussis (Tdap/Td) vaccine.** / 1 dose of Td every 10 years.  Varicella vaccine.** / Consult your health care provider.  Zoster vaccine.** / 1 dose for adults aged 61 years or older.  Pneumococcal 13-valent conjugate (PCV13) vaccine.** / Consult your health care provider.  Pneumococcal  polysaccharide (PPSV23) vaccine.** / 1 dose for all adults aged 28 years and older.  Meningococcal vaccine.** / Consult your health care provider.  Hepatitis A vaccine.** / Consult your health care provider.  Hepatitis B vaccine.** / Consult your health care provider.  Haemophilus influenzae type b (Hib) vaccine.** / Consult your health care provider. ** Family history and personal history of risk and conditions may change your health care provider's recommendations. Document Released: 09/13/2001 Document Revised: 12/02/2013 Document Reviewed: 12/13/2010 [facility] Patient Information 2015 [place], [place]. This information is not intended to replace advice given to you by your health care provider. Make sure you discuss any questions you have with your health care provider.

## 2014-12-15 NOTE — Progress Notes (Signed)
Pre visit review using our clinic review tool, if applicable. No additional management support is needed unless otherwise documented below in the visit note. 

## 2014-12-15 NOTE — Progress Notes (Signed)
Subjective:     Ann Maddox is a 54 y.o. female and is here for a comprehensive physical exam. The patient reports no problems.  History   Social History  . Marital Status: Married    Spouse Name: N/A  . Number of Children: N/A  . Years of Education: N/A   Occupational History  . Not on file.   Social History Main Topics  . Smoking status: Never Smoker   . Smokeless tobacco: Never Used  . Alcohol Use: 1.8 oz/week    3 Glasses of wine per week     Comment: Red wine  . Drug Use: No  . Sexual Activity:    Partners: Male   Other Topics Concern  . Not on file   Social History Narrative   Health Maintenance  Topic Date Due  . HIV Screening  05/02/1976  . INFLUENZA VACCINE  03/02/2015  . MAMMOGRAM  06/22/2015  . PAP SMEAR  08/15/2015  . COLONOSCOPY  06/18/2017  . TETANUS/TDAP  08/14/2022    The following portions of the patient's history were reviewed and updated as appropriate:  She  has a past medical history of Allergy. She  does not have any pertinent problems on file. 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 Her family history includes Cancer in her father; Diabetes in her father; Glaucoma in her mother; Heart disease in her father, paternal grandfather, and paternal uncle; Hyperlipidemia in her father and mother; Hypertension in her father and mother. There is no history of Colon cancer. She  reports that she has never smoked. She has never used smokeless tobacco. She reports that she drinks about 1.8 oz of alcohol per week. She reports that she does not use illicit drugs. She has a current medication list which includes the following prescription(s): boswellia serrata, cetirizine, collagen hydrolysate, fluconazole, magnesium, mometasone, multivitamin, omega-3 fatty acids, polyethylene glycol powder, probiotic product, and UNABLE TO  FIND. Current Outpatient Prescriptions on File Prior to Visit  Medication Sig Dispense Refill  . Boswellia Serrata (BOSWELLIA PO) Take 2 capsules by mouth daily.    . cetirizine (ZYRTEC) 10 MG tablet Take 10 mg by mouth daily.      . Collagen Hydrolysate POWD Take 1 each by mouth daily.    . fluconazole (DIFLUCAN) 150 MG tablet Take 1 tablet (150 mg total) by mouth once. And repeat in 3 days if needed 2 tablet 0  . Magnesium 400 MG CAPS Take 2 capsules by mouth daily.    . mometasone (NASONEX) 50 MCG/ACT nasal spray Place 2 sprays into the nose daily as needed. 17 g 11  . Multiple Vitamin (MULTIVITAMIN) tablet Take 1 tablet by mouth daily.    . Omega-3 Fatty Acids (OMEGA 3 PO) Take 2 capsules by mouth daily.    . polyethylene glycol powder (MIRALAX) powder Take 17 g by mouth daily.    . Probiotic Product (PROBIOTIC DAILY PO) Take 3 capsules by mouth daily.    Marland Kitchen UNABLE TO FIND Take 2 tablets by mouth daily. Med Name: Ziflamend     No current facility-administered medications on file prior to visit.   She has No Known Allergies..  Review of Systems Review of Systems  Constitutional: Negative for activity change, appetite change and fatigue.  HENT: Negative for hearing loss, congestion, tinnitus and ear discharge.  dentist q23m Eyes: Negative for visual disturbance (see optho q1y -- vision  corrected to 20/20 with glasses).  Respiratory: Negative for cough, chest tightness and shortness of breath.   Cardiovascular: Negative for chest pain, palpitations and leg swelling.  Gastrointestinal: Negative for abdominal pain, diarrhea, constipation and abdominal distention.  Genitourinary: Negative for urgency, frequency, decreased urine volume and difficulty urinating.  Musculoskeletal: Negative for back pain, arthralgias and gait problem.  Skin: Negative for color change, pallor and rash.  Neurological: Negative for dizziness, light-headedness, numbness and headaches.  Hematological: Negative for  adenopathy. Does not bruise/bleed easily.  Psychiatric/Behavioral: Negative for suicidal ideas, confusion, sleep disturbance, self-injury, dysphoric mood, decreased concentration and agitation.       Objective:    BP 136/84 mmHg  Pulse 79  Temp(Src) 98.3 F (36.8 C) (Oral)  Resp 16  Ht 6' (1.829 m)  Wt 177 lb (80.287 kg)  BMI 24.00 kg/m2  SpO2 99% General appearance: alert, cooperative, appears stated age and no distress Head: Normocephalic, without obvious abnormality, atraumatic Eyes: conjunctivae/corneas clear. PERRL, EOM's intact. Fundi benign. Ears: normal TM's and external ear canals both ears Nose: Nares normal. Septum midline. Mucosa normal. No drainage or sinus tenderness. Throat: lips, mucosa, and tongue normal; teeth and gums normal Neck: no adenopathy, no carotid bruit, no JVD, supple, symmetrical, trachea midline and thyroid not enlarged, symmetric, no tenderness/mass/nodules Back: symmetric, no curvature. ROM normal. No CVA tenderness. Lungs: clear to auscultation bilaterally Breasts: normal appearance, no masses or tenderness Heart: regular rate and rhythm, S1, S2 normal, no murmur, click, rub or gallop Abdomen: soft, non-tender; bowel sounds normal; no masses,  no organomegaly Pelvic: cervix normal in appearance, external genitalia normal, no adnexal masses or tenderness, no cervical motion tenderness, rectovaginal septum normal, uterus normal size, shape, and consistency, vagina normal without discharge and pap done Extremities: extremities normal, atraumatic, no cyanosis or edema Pulses: 2+ and symmetric Skin: Skin color, texture, turgor normal. No rashes or lesions Lymph nodes: Cervical, supraclavicular, and axillary nodes normal. Neurologic: Alert and oriented X 3, normal strength and tone. Normal symmetric reflexes. Normal coordination and gait Psych- no depression, no anxiety      Assessment:    Healthy female exam.      Plan:    ghm utd Check  labs See After Visit Summary for Counseling Recommendations   1. Preventative health care  See above - Lipid Profile - Vitamin D 1,25 dihydroxy - Basic metabolic panel - CBC with Differential/Platelet - Hepatic function panel - Lipid panel - POCT urinalysis dipstick - TSH  2. Menopausal hot flushes   - Estradiol - Progesterone - [facility]

## 2014-12-16 LAB — CYTOLOGY - PAP

## 2014-12-18 LAB — VITAMIN D 1,25 DIHYDROXY
Vitamin D 1, 25 (OH)2 Total: 51 pg/mL (ref 18–72)
Vitamin D2 1, 25 (OH)2: <8 pg/mL
Vitamin D3 1, 25 (OH)2: 51 pg/mL

## 2014-12-23 ENCOUNTER — Ambulatory Visit: Payer: Self-pay | Admitting: Internal Medicine

## 2015-01-06 ENCOUNTER — Encounter: Payer: Self-pay | Admitting: Internal Medicine

## 2015-04-22 ENCOUNTER — Other Ambulatory Visit: Payer: Self-pay | Admitting: Family Medicine

## 2015-04-22 MED ORDER — CYCLOBENZAPRINE HCL 10 MG PO TABS: 10.0000 mg | ORAL_TABLET | Freq: Three times a day (TID) | ORAL | Status: DC | PRN

## 2015-04-22 NOTE — Telephone Encounter (Signed)
Rx sent, notified pt. 

## 2015-04-22 NOTE — Telephone Encounter (Signed)
Can be reached: [phone] Pharmacy: [id number] - [place], [place]  Reason for call: Pt calling for refill on cyclobenzaprine. She states she has intermittent back pain and has been working with a Restaurant manager, fast food. She states that you know her and you know she won't call in for something she doesn't need. Last filled in 2014. Pt declined appt but said this episode started on Saturday.

## 2015-04-22 NOTE — Telephone Encounter (Signed)
To MD to approve.     KP

## 2015-04-22 NOTE — Telephone Encounter (Signed)
Refill x1 

## 2015-11-18 DIAGNOSIS — H40013 Open angle with borderline findings, low risk, bilateral: Secondary | ICD-10-CM | POA: Diagnosis not present

## 2015-12-17 ENCOUNTER — Encounter: Payer: Self-pay | Admitting: Family Medicine

## 2015-12-17 ENCOUNTER — Encounter: Payer: BLUE CROSS/BLUE SHIELD | Admitting: Family Medicine

## 2015-12-17 ENCOUNTER — Ambulatory Visit (INDEPENDENT_AMBULATORY_CARE_PROVIDER_SITE_OTHER): Payer: BLUE CROSS/BLUE SHIELD | Admitting: Family Medicine

## 2015-12-17 VITALS — BP 138/76 | HR 64 | Temp 98.2°F | Resp 16 | Ht 72.5 in | Wt 179.6 lb

## 2015-12-17 DIAGNOSIS — Z1231 Encounter for screening mammogram for malignant neoplasm of breast: Secondary | ICD-10-CM | POA: Diagnosis not present

## 2015-12-17 DIAGNOSIS — J302 Other seasonal allergic rhinitis: Secondary | ICD-10-CM | POA: Diagnosis not present

## 2015-12-17 MED ORDER — MOMETASONE FUROATE 50 MCG/ACT NA SUSP: 2.0000 | Freq: Every day | NASAL | Status: DC | PRN

## 2015-12-17 NOTE — Patient Instructions (Signed)
Preventive Care for Adults, Female A healthy lifestyle and preventive care can promote health and wellness. Preventive health guidelines for women include the following key practices.  A routine yearly physical is a good way to check with your health care provider about your health and preventive screening. It is a chance to share any concerns and updates on your health and to receive a thorough exam.  Visit your dentist for a routine exam and preventive care every 6 months. Brush your teeth twice a day and floss once a day. Good oral hygiene prevents tooth decay and gum disease.  The frequency of eye exams is based on your age, health, family medical history, use of contact lenses, and other factors. Follow your health care provider's recommendations for frequency of eye exams.  Eat a healthy diet. Foods like vegetables, fruits, whole grains, low-fat dairy products, and lean protein foods contain the nutrients you need without too many calories. Decrease your intake of foods high in solid fats, added sugars, and salt. Eat the right amount of calories for you.Get information about a proper diet from your health care provider, if necessary.  Regular physical exercise is one of the most important things you can do for your health. Most adults should get at least 150 minutes of moderate-intensity exercise (any activity that increases your heart rate and causes you to sweat) each week. In addition, most adults need muscle-strengthening exercises on 2 or more days a week.  Maintain a healthy weight. The body mass index (BMI) is a screening tool to identify possible weight problems. It provides an estimate of body fat based on height and weight. Your health care provider can find your BMI and can help you achieve or maintain a healthy weight.For adults 20 years and older:  A BMI below 18.5 is considered underweight.  A BMI of 18.5 to 24.9 is normal.  A BMI of 25 to 29.9 is considered overweight.  A  BMI of 30 and above is considered obese.  Maintain normal blood lipids and cholesterol levels by exercising and minimizing your intake of saturated fat. Eat a balanced diet with plenty of fruit and vegetables. Blood tests for lipids and cholesterol should begin at age 45 and be repeated every 5 years. If your lipid or cholesterol levels are high, you are over 50, or you are at high risk for heart disease, you may need your cholesterol levels checked more frequently.Ongoing high lipid and cholesterol levels should be treated with medicines if diet and exercise are not working.  If you smoke, find out from your health care provider how to quit. If you do not use tobacco, do not start.  Lung cancer screening is recommended for adults aged 45-80 years who are at high risk for developing lung cancer because of a history of smoking. A yearly low-dose CT scan of the lungs is recommended for people who have at least a 30-pack-year history of smoking and are a current smoker or have quit within the past 15 years. A pack year of smoking is smoking an average of 1 pack of cigarettes a day for 1 year (for example: 1 pack a day for 30 years or 2 packs a day for 15 years). Yearly screening should continue until the smoker has stopped smoking for at least 15 years. Yearly screening should be stopped for people who develop a health problem that would prevent them from having lung cancer treatment.  If you are pregnant, do not drink alcohol. If you are  breastfeeding, be very cautious about drinking alcohol. If you are not pregnant and choose to drink alcohol, do not have more than 1 drink per day. One drink is considered to be 12 ounces (355 mL) of beer, 5 ounces (148 mL) of wine, or 1.5 ounces (44 mL) of liquor.  Avoid use of street drugs. Do not share needles with anyone. Ask for help if you need support or instructions about stopping the use of drugs.  High blood pressure causes heart disease and increases the risk  of stroke. Your blood pressure should be checked at least every 1 to 2 years. Ongoing high blood pressure should be treated with medicines if weight loss and exercise do not work.  If you are 55-79 years old, ask your health care provider if you should take aspirin to prevent strokes.  Diabetes screening is done by taking a blood sample to check your blood glucose level after you have not eaten for a certain period of time (fasting). If you are not overweight and you do not have risk factors for diabetes, you should be screened once every 3 years starting at age 45. If you are overweight or obese and you are 40-70 years of age, you should be screened for diabetes every year as part of your cardiovascular risk assessment.  Breast cancer screening is essential preventive care for women. You should practice "breast self-awareness." This means understanding the normal appearance and feel of your breasts and may include breast self-examination. Any changes detected, no matter how small, should be reported to a health care provider. Women in their 20s and 30s should have a clinical breast exam (CBE) by a health care provider as part of a regular health exam every 1 to 3 years. After age 40, women should have a CBE every year. Starting at age 40, women should consider having a mammogram (breast X-ray test) every year. Women who have a family history of breast cancer should talk to their health care provider about genetic screening. Women at a high risk of breast cancer should talk to their health care providers about having an MRI and a mammogram every year.  Breast cancer gene (BRCA)-related cancer risk assessment is recommended for women who have family members with BRCA-related cancers. BRCA-related cancers include breast, ovarian, tubal, and peritoneal cancers. Having family members with these cancers may be associated with an increased risk for harmful changes (mutations) in the breast cancer genes BRCA1 and  BRCA2. Results of the assessment will determine the need for genetic counseling and BRCA1 and BRCA2 testing.  Your health care provider may recommend that you be screened regularly for cancer of the pelvic organs (ovaries, uterus, and vagina). This screening involves a pelvic examination, including checking for microscopic changes to the surface of your cervix (Pap test). You may be encouraged to have this screening done every 3 years, beginning at age 21.  For women ages 30-65, health care providers may recommend pelvic exams and Pap testing every 3 years, or they may recommend the Pap and pelvic exam, combined with testing for human papilloma virus (HPV), every 5 years. Some types of HPV increase your risk of cervical cancer. Testing for HPV may also be done on women of any age with unclear Pap test results.  Other health care providers may not recommend any screening for nonpregnant women who are considered low risk for pelvic cancer and who do not have symptoms. Ask your health care provider if a screening pelvic exam is right for   you.  If you have had past treatment for cervical cancer or a condition that could lead to cancer, you need Pap tests and screening for cancer for at least 20 years after your treatment. If Pap tests have been discontinued, your risk factors (such as having a new sexual partner) need to be reassessed to determine if screening should resume. Some women have medical problems that increase the chance of getting cervical cancer. In these cases, your health care provider may recommend more frequent screening and Pap tests.  Colorectal cancer can be detected and often prevented. Most routine colorectal cancer screening begins at the age of 50 years and continues through age 75 years. However, your health care provider may recommend screening at an earlier age if you have risk factors for colon cancer. On a yearly basis, your health care provider may provide home test kits to check  for hidden blood in the stool. Use of a small camera at the end of a tube, to directly examine the colon (sigmoidoscopy or colonoscopy), can detect the earliest forms of colorectal cancer. Talk to your health care provider about this at age 50, when routine screening begins. Direct exam of the colon should be repeated every 5-10 years through age 75 years, unless early forms of precancerous polyps or small growths are found.  People who are at an increased risk for hepatitis B should be screened for this virus. You are considered at high risk for hepatitis B if:  You were born in a country where hepatitis B occurs often. Talk with your health care provider about which countries are considered high risk.  Your parents were born in a high-risk country and you have not received a shot to protect against hepatitis B (hepatitis B vaccine).  You have HIV or AIDS.  You use needles to inject street drugs.  You live with, or have sex with, someone who has hepatitis B.  You get hemodialysis treatment.  You take certain medicines for conditions like cancer, organ transplantation, and autoimmune conditions.  Hepatitis C blood testing is recommended for all people born from 1945 through 1965 and any individual with known risks for hepatitis C.  Practice safe sex. Use condoms and avoid high-risk sexual practices to reduce the spread of sexually transmitted infections (STIs). STIs include gonorrhea, chlamydia, syphilis, trichomonas, herpes, HPV, and human immunodeficiency virus (HIV). Herpes, HIV, and HPV are viral illnesses that have no cure. They can result in disability, cancer, and death.  You should be screened for sexually transmitted illnesses (STIs) including gonorrhea and chlamydia if:  You are sexually active and are younger than 24 years.  You are older than 24 years and your health care provider tells you that you are at risk for this type of infection.  Your sexual activity has changed  since you were last screened and you are at an increased risk for chlamydia or gonorrhea. Ask your health care provider if you are at risk.  If you are at risk of being infected with HIV, it is recommended that you take a prescription medicine daily to prevent HIV infection. This is called preexposure prophylaxis (PrEP). You are considered at risk if:  You are sexually active and do not regularly use condoms or know the HIV status of your partner(s).  You take drugs by injection.  You are sexually active with a partner who has HIV.  Talk with your health care provider about whether you are at high risk of being infected with HIV. If   you choose to begin PrEP, you should first be tested for HIV. You should then be tested every 3 months for as long as you are taking PrEP.  Osteoporosis is a disease in which the bones lose minerals and strength with aging. This can result in serious bone fractures or breaks. The risk of osteoporosis can be identified using a bone density scan. Women ages 67 years and over and women at risk for fractures or osteoporosis should discuss screening with their health care providers. Ask your health care provider whether you should take a calcium supplement or vitamin D to reduce the rate of osteoporosis.  Menopause can be associated with physical symptoms and risks. Hormone replacement therapy is available to decrease symptoms and risks. You should talk to your health care provider about whether hormone replacement therapy is right for you.  Use sunscreen. Apply sunscreen liberally and repeatedly throughout the day. You should seek shade when your shadow is shorter than you. Protect yourself by wearing long sleeves, pants, a wide-brimmed hat, and sunglasses year round, whenever you are outdoors.  Once a month, do a whole body skin exam, using a mirror to look at the skin on your back. Tell your health care provider of new moles, moles that have irregular borders, moles that  are larger than a pencil eraser, or moles that have changed in shape or color.  Stay current with required vaccines (immunizations).  Influenza vaccine. All adults should be immunized every year.  Tetanus, diphtheria, and acellular pertussis (Td, Tdap) vaccine. Pregnant women should receive 1 dose of Tdap vaccine during each pregnancy. The dose should be obtained regardless of the length of time since the last dose. Immunization is preferred during the 27th-36th week of gestation. An adult who has not previously received Tdap or who does not know her vaccine status should receive 1 dose of Tdap. This initial dose should be followed by tetanus and diphtheria toxoids (Td) booster doses every 10 years. Adults with an unknown or incomplete history of completing a 3-dose immunization series with Td-containing vaccines should begin or complete a primary immunization series including a Tdap dose. Adults should receive a Td booster every 10 years.  Varicella vaccine. An adult without evidence of immunity to varicella should receive 2 doses or a second dose if she has previously received 1 dose. Pregnant females who do not have evidence of immunity should receive the first dose after pregnancy. This first dose should be obtained before leaving the health care facility. The second dose should be obtained 4-8 weeks after the first dose.  Human papillomavirus (HPV) vaccine. Females aged 13-26 years who have not received the vaccine previously should obtain the 3-dose series. The vaccine is not recommended for use in pregnant females. However, pregnancy testing is not needed before receiving a dose. If a female is found to be pregnant after receiving a dose, no treatment is needed. In that case, the remaining doses should be delayed until after the pregnancy. Immunization is recommended for any person with an immunocompromised condition through the age of 61 years if she did not get any or all doses earlier. During the  3-dose series, the second dose should be obtained 4-8 weeks after the first dose. The third dose should be obtained 24 weeks after the first dose and 16 weeks after the second dose.  Zoster vaccine. One dose is recommended for adults aged 30 years or older unless certain conditions are present.  Measles, mumps, and rubella (MMR) vaccine. Adults born  before 1957 generally are considered immune to measles and mumps. Adults born in 1957 or later should have 1 or more doses of MMR vaccine unless there is a contraindication to the vaccine or there is laboratory evidence of immunity to each of the three diseases. A routine second dose of MMR vaccine should be obtained at least 28 days after the first dose for students attending postsecondary schools, health care workers, or international travelers. People who received inactivated measles vaccine or an unknown type of measles vaccine during 1963-1967 should receive 2 doses of MMR vaccine. People who received inactivated mumps vaccine or an unknown type of mumps vaccine before 1979 and are at high risk for mumps infection should consider immunization with 2 doses of MMR vaccine. For females of childbearing age, rubella immunity should be determined. If there is no evidence of immunity, females who are not pregnant should be vaccinated. If there is no evidence of immunity, females who are pregnant should delay immunization until after pregnancy. Unvaccinated health care workers born before 1957 who lack laboratory evidence of measles, mumps, or rubella immunity or laboratory confirmation of disease should consider measles and mumps immunization with 2 doses of MMR vaccine or rubella immunization with 1 dose of MMR vaccine.  Pneumococcal 13-valent conjugate (PCV13) vaccine. When indicated, a person who is uncertain of his immunization history and has no record of immunization should receive the PCV13 vaccine. All adults 65 years of age and older should receive this  vaccine. An adult aged 19 years or older who has certain medical conditions and has not been previously immunized should receive 1 dose of PCV13 vaccine. This PCV13 should be followed with a dose of pneumococcal polysaccharide (PPSV23) vaccine. Adults who are at high risk for pneumococcal disease should obtain the PPSV23 vaccine at least 8 weeks after the dose of PCV13 vaccine. Adults older than 55 years of age who have normal immune system function should obtain the PPSV23 vaccine dose at least 1 year after the dose of PCV13 vaccine.  Pneumococcal polysaccharide (PPSV23) vaccine. When PCV13 is also indicated, PCV13 should be obtained first. All adults aged 65 years and older should be immunized. An adult younger than age 65 years who has certain medical conditions should be immunized. Any person who resides in a nursing home or long-term care facility should be immunized. An adult smoker should be immunized. People with an immunocompromised condition and certain other conditions should receive both PCV13 and PPSV23 vaccines. People with human immunodeficiency virus (HIV) infection should be immunized as soon as possible after diagnosis. Immunization during chemotherapy or radiation therapy should be avoided. Routine use of PPSV23 vaccine is not recommended for American Indians, [place] Natives, or people younger than 65 years unless there are medical conditions that require PPSV23 vaccine. When indicated, people who have unknown immunization and have no record of immunization should receive PPSV23 vaccine. One-time revaccination 5 years after the first dose of PPSV23 is recommended for people aged 19-64 years who have chronic kidney failure, nephrotic syndrome, asplenia, or immunocompromised conditions. People who received 1-2 doses of PPSV23 before age 65 years should receive another dose of PPSV23 vaccine at age 65 years or later if at least 5 years have passed since the previous dose. Doses of PPSV23 are not  needed for people immunized with PPSV23 at or after age 65 years.  Meningococcal vaccine. Adults with asplenia or persistent complement component deficiencies should receive 2 doses of quadrivalent meningococcal conjugate (MenACWY-D) vaccine. The doses should be obtained   at least 2 months apart. Microbiologists working with certain meningococcal bacteria, [place] recruits, people at risk during an outbreak, and people who travel to or live in countries with a high rate of meningitis should be immunized. A first-year college student up through age 34 years who is living in a residence hall should receive a dose if she did not receive a dose on or after her 16th birthday. Adults who have certain high-risk conditions should receive one or more doses of vaccine.  Hepatitis A vaccine. Adults who wish to be protected from this disease, have certain high-risk conditions, work with hepatitis A-infected animals, work in hepatitis A research labs, or travel to or work in countries with a high rate of hepatitis A should be immunized. Adults who were previously unvaccinated and who anticipate close contact with an international adoptee during the first 60 days after arrival in the [place] from a country with a high rate of hepatitis A should be immunized.  Hepatitis B vaccine. Adults who wish to be protected from this disease, have certain high-risk conditions, may be exposed to blood or other infectious body fluids, are household contacts or sex partners of hepatitis B positive people, are clients or workers in certain care facilities, or travel to or work in countries with a high rate of hepatitis B should be immunized.  Haemophilus influenzae type b (Hib) vaccine. A previously unvaccinated person with asplenia or sickle cell disease or having a scheduled splenectomy should receive 1 dose of Hib vaccine. Regardless of previous immunization, a recipient of a hematopoietic stem cell transplant should receive a  3-dose series 6-12 months after her successful transplant. Hib vaccine is not recommended for adults with HIV infection. Preventive Services / Frequency Ages 35 to 4 years  Blood pressure check.** / Every 3-5 years.  Lipid and cholesterol check.** / Every 5 years beginning at age 60.  Clinical breast exam.** / Every 3 years for women in their 71s and 10s.  BRCA-related cancer risk assessment.** / For women who have family members with a BRCA-related cancer (breast, ovarian, tubal, or peritoneal cancers).  Pap test.** / Every 2 years from ages 76 through 26. Every 3 years starting at age 61 through age 76 or 93 with a history of 3 consecutive normal Pap tests.  HPV screening.** / Every 3 years from ages 37 through ages 60 to 51 with a history of 3 consecutive normal Pap tests.  Hepatitis C blood test.** / For any individual with known risks for hepatitis C.  Skin self-exam. / Monthly.  Influenza vaccine. / Every year.  Tetanus, diphtheria, and acellular pertussis (Tdap, Td) vaccine.** / Consult your health care provider. Pregnant women should receive 1 dose of Tdap vaccine during each pregnancy. 1 dose of Td every 10 years.  Varicella vaccine.** / Consult your health care provider. Pregnant females who do not have evidence of immunity should receive the first dose after pregnancy.  HPV vaccine. / 3 doses over 6 months, if 93 and younger. The vaccine is not recommended for use in pregnant females. However, pregnancy testing is not needed before receiving a dose.  Measles, mumps, rubella (MMR) vaccine.** / You need at least 1 dose of MMR if you were born in 1957 or later. You may also need a 2nd dose. For females of childbearing age, rubella immunity should be determined. If there is no evidence of immunity, females who are not pregnant should be vaccinated. If there is no evidence of immunity, females who are  pregnant should delay immunization until after pregnancy.  Pneumococcal  13-valent conjugate (PCV13) vaccine.** / Consult your health care provider.  Pneumococcal polysaccharide (PPSV23) vaccine.** / 1 to 2 doses if you smoke cigarettes or if you have certain conditions.  Meningococcal vaccine.** / 1 dose if you are age 68 to 8 years and a Market researcher living in a residence hall, or have one of several medical conditions, you need to get vaccinated against meningococcal disease. You may also need additional booster doses.  Hepatitis A vaccine.** / Consult your health care provider.  Hepatitis B vaccine.** / Consult your health care provider.  Haemophilus influenzae type b (Hib) vaccine.** / Consult your health care provider. Ages 7 to 53 years  Blood pressure check.** / Every year.  Lipid and cholesterol check.** / Every 5 years beginning at age 25 years.  Lung cancer screening. / Every year if you are aged 11-80 years and have a 30-pack-year history of smoking and currently smoke or have quit within the past 15 years. Yearly screening is stopped once you have quit smoking for at least 15 years or develop a health problem that would prevent you from having lung cancer treatment.  Clinical breast exam.** / Every year after age 48 years.  BRCA-related cancer risk assessment.** / For women who have family members with a BRCA-related cancer (breast, ovarian, tubal, or peritoneal cancers).  Mammogram.** / Every year beginning at age 41 years and continuing for as long as you are in good health. Consult with your health care provider.  Pap test.** / Every 3 years starting at age 65 years through age 37 or 70 years with a history of 3 consecutive normal Pap tests.  HPV screening.** / Every 3 years from ages 72 years through ages 60 to 40 years with a history of 3 consecutive normal Pap tests.  Fecal occult blood test (FOBT) of stool. / Every year beginning at age 21 years and continuing until age 5 years. You may not need to do this test if you get  a colonoscopy every 10 years.  Flexible sigmoidoscopy or colonoscopy.** / Every 5 years for a flexible sigmoidoscopy or every 10 years for a colonoscopy beginning at age 35 years and continuing until age 48 years.  Hepatitis C blood test.** / For all people born from [birth year] through 1965 and any individual with known risks for hepatitis C.  Skin self-exam. / Monthly.  Influenza vaccine. / Every year.  Tetanus, diphtheria, and acellular pertussis (Tdap/Td) vaccine.** / Consult your health care provider. Pregnant women should receive 1 dose of Tdap vaccine during each pregnancy. 1 dose of Td every 10 years.  Varicella vaccine.** / Consult your health care provider. Pregnant females who do not have evidence of immunity should receive the first dose after pregnancy.  Zoster vaccine.** / 1 dose for adults aged 30 years or older.  Measles, mumps, rubella (MMR) vaccine.** / You need at least 1 dose of MMR if you were born in 1957 or later. You may also need a second dose. For females of childbearing age, rubella immunity should be determined. If there is no evidence of immunity, females who are not pregnant should be vaccinated. If there is no evidence of immunity, females who are pregnant should delay immunization until after pregnancy.  Pneumococcal 13-valent conjugate (PCV13) vaccine.** / Consult your health care provider.  Pneumococcal polysaccharide (PPSV23) vaccine.** / 1 to 2 doses if you smoke cigarettes or if you have certain conditions.  Meningococcal vaccine.** /  Consult your health care provider.  Hepatitis A vaccine.** / Consult your health care provider.  Hepatitis B vaccine.** / Consult your health care provider.  Haemophilus influenzae type b (Hib) vaccine.** / Consult your health care provider. Ages 64 years and over  Blood pressure check.** / Every year.  Lipid and cholesterol check.** / Every 5 years beginning at age 23 years.  Lung cancer screening. / Every year if you  are aged 16-80 years and have a 30-pack-year history of smoking and currently smoke or have quit within the past 15 years. Yearly screening is stopped once you have quit smoking for at least 15 years or develop a health problem that would prevent you from having lung cancer treatment.  Clinical breast exam.** / Every year after age 74 years.  BRCA-related cancer risk assessment.** / For women who have family members with a BRCA-related cancer (breast, ovarian, tubal, or peritoneal cancers).  Mammogram.** / Every year beginning at age 44 years and continuing for as long as you are in good health. Consult with your health care provider.  Pap test.** / Every 3 years starting at age 58 years through age 22 or 39 years with 3 consecutive normal Pap tests. Testing can be stopped between 65 and 70 years with 3 consecutive normal Pap tests and no abnormal Pap or HPV tests in the past 10 years.  HPV screening.** / Every 3 years from ages 64 years through ages 70 or 61 years with a history of 3 consecutive normal Pap tests. Testing can be stopped between 65 and 70 years with 3 consecutive normal Pap tests and no abnormal Pap or HPV tests in the past 10 years.  Fecal occult blood test (FOBT) of stool. / Every year beginning at age 40 years and continuing until age 27 years. You may not need to do this test if you get a colonoscopy every 10 years.  Flexible sigmoidoscopy or colonoscopy.** / Every 5 years for a flexible sigmoidoscopy or every 10 years for a colonoscopy beginning at age 7 years and continuing until age 32 years.  Hepatitis C blood test.** / For all people born from [birth year] through 1965 and any individual with known risks for hepatitis C.  Osteoporosis screening.** / A one-time screening for women ages 30 years and over and women at risk for fractures or osteoporosis.  Skin self-exam. / Monthly.  Influenza vaccine. / Every year.  Tetanus, diphtheria, and acellular pertussis (Tdap/Td)  vaccine.** / 1 dose of Td every 10 years.  Varicella vaccine.** / Consult your health care provider.  Zoster vaccine.** / 1 dose for adults aged 35 years or older.  Pneumococcal 13-valent conjugate (PCV13) vaccine.** / Consult your health care provider.  Pneumococcal polysaccharide (PPSV23) vaccine.** / 1 dose for all adults aged 46 years and older.  Meningococcal vaccine.** / Consult your health care provider.  Hepatitis A vaccine.** / Consult your health care provider.  Hepatitis B vaccine.** / Consult your health care provider.  Haemophilus influenzae type b (Hib) vaccine.** / Consult your health care provider. ** Family history and personal history of risk and conditions may change your health care provider's recommendations.   This information is not intended to replace advice given to you by your health care provider. Make sure you discuss any questions you have with your health care provider.   Document Released: 09/13/2001 Document Revised: 08/08/2014 Document Reviewed: 12/13/2010 Elsevier Interactive Patient Education [employer].

## 2015-12-17 NOTE — Progress Notes (Signed)
Subjective:     Ann Maddox is a 55 y.o. female and is here for a comprehensive physical exam. The patient reports no problems.  Social History   Social History  . Marital Status: Married    Spouse Name: N/A  . Number of Children: N/A  . Years of Education: N/A   Occupational History  . Not on file.   Social History Main Topics  . Smoking status: Never Smoker   . Smokeless tobacco: Never Used  . Alcohol Use: 1.8 oz/week    3 Glasses of wine per week     Comment: Red wine  . Drug Use: No  . Sexual Activity:    Partners: Male   Other Topics Concern  . Not on file   Social History Narrative   Health Maintenance  Topic Date Due  . Hepatitis C Screening  April 10, 1961  . HIV Screening  05/02/1976  . MAMMOGRAM  06/22/2015  . INFLUENZA VACCINE  03/01/2016  . COLONOSCOPY  06/18/2017  . PAP SMEAR  12/14/2017  . TETANUS/TDAP  08/14/2022    The following portions of the patient's history were reviewed and updated as appropriate:  She  has a past medical history of Allergy. She  does not have any pertinent problems on file. 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 Her family history includes Cancer in her father; Diabetes in her father; Glaucoma in her mother; Heart disease in her father, paternal grandfather, and paternal uncle; Hyperlipidemia in her father and mother; Hypertension in her father and mother. There is no history of Colon cancer. She  reports that she has never smoked. She has never used smokeless tobacco. She reports that she drinks about 1.8 oz of alcohol per week. She reports that she does not use illicit drugs. She has a current medication list which includes the following prescription(s): boswellia serrata, cetirizine, magnesium, multivitamin, multiple vitamins-minerals, NON FORMULARY, omega-3 fatty acids, probiotic product,  UNABLE TO FIND, UNABLE TO FIND, and mometasone. Current Outpatient Prescriptions on File Prior to Visit  Medication Sig Dispense Refill  . Boswellia Serrata (BOSWELLIA PO) Take 2 capsules by mouth daily.    . cetirizine (ZYRTEC) 10 MG tablet Take 10 mg by mouth daily.      . Magnesium 400 MG CAPS Take 2 capsules by mouth daily. Reported on 12/17/2015    . Multiple Vitamin (MULTIVITAMIN) tablet Take 1 tablet by mouth daily.    . Omega-3 Fatty Acids (OMEGA 3 PO) Take 2 capsules by mouth daily.    . Probiotic Product (PROBIOTIC DAILY PO) Take 3 capsules by mouth daily.    Marland Kitchen UNABLE TO FIND Take 2 tablets by mouth daily. Med Name: Ziflamend     No current facility-administered medications on file prior to visit.   She has No Known Allergies..  Review of Systems Review of Systems  Constitutional: Negative for activity change, appetite change and fatigue.  HENT: Negative for hearing loss, congestion, tinnitus and ear discharge.  dentist q73m Eyes: Negative for visual disturbance (see optho q1y -- vision corrected to 20/20 with glasses).  Respiratory: Negative for cough, chest tightness and shortness of breath.   Cardiovascular: Negative for chest pain, palpitations and leg swelling.  Gastrointestinal: Negative for abdominal pain, diarrhea, constipation and abdominal distention.  Genitourinary: Negative for urgency, frequency, decreased urine volume and difficulty urinating.  Musculoskeletal: Negative for back pain, arthralgias and gait problem.  Skin:  Negative for color change, pallor and rash.  Neurological: Negative for dizziness, light-headedness, numbness and headaches.  Hematological: Negative for adenopathy. Does not bruise/bleed easily.  Psychiatric/Behavioral: Negative for suicidal ideas, confusion, sleep disturbance, self-injury, dysphoric mood, decreased concentration and agitation.       Objective:    BP 138/76 mmHg  Pulse 64  Temp(Src) 98.2 F (36.8 C) (Oral)  Resp 16  Ht  6' 0.5" (1.842 m)  Wt 179 lb 9.6 oz (81.466 kg)  BMI 24.01 kg/m2  SpO2 98% General appearance: alert, cooperative, appears stated age and no distress Head: Normocephalic, without obvious abnormality, atraumatic Eyes: conjunctivae/corneas clear. PERRL, EOM's intact. Fundi benign. Ears: normal TM's and external ear canals both ears Nose: Nares normal. Septum midline. Mucosa normal. No drainage or sinus tenderness. Throat: lips, mucosa, and tongue normal; teeth and gums normal Neck: no adenopathy, no carotid bruit, no JVD, supple, symmetrical, trachea midline and thyroid not enlarged, symmetric, no tenderness/mass/nodules Back: symmetric, no curvature. ROM normal. No CVA tenderness. Lungs: clear to auscultation bilaterally Breasts: normal appearance, no masses or tenderness Heart: regular rate and rhythm, S1, S2 normal, no murmur, click, rub or gallop Abdomen: soft, non-tender; bowel sounds normal; no masses,  no organomegaly Pelvic: deferred Extremities: extremities normal, atraumatic, no cyanosis or edema Pulses: 2+ and symmetric Skin: Skin color, texture, turgor normal. No rashes or lesions Lymph nodes: Cervical, supraclavicular, and axillary nodes normal. Neurologic: Alert and oriented X 3, normal strength and tone. Normal symmetric reflexes. Normal coordination and gait Psych- no depression, no anxiety      Assessment:    Healthy female exam.      Plan:     ghm utd Check labs See After Visit Summary for Counseling Recommendations

## 2015-12-17 NOTE — Progress Notes (Signed)
Pre visit review using our clinic review tool, if applicable. No additional management support is needed unless otherwise documented below in the visit note. 

## 2015-12-22 ENCOUNTER — Ambulatory Visit (HOSPITAL_BASED_OUTPATIENT_CLINIC_OR_DEPARTMENT_OTHER): Payer: BLUE CROSS/BLUE SHIELD

## 2015-12-23 DIAGNOSIS — M25511 Pain in right shoulder: Secondary | ICD-10-CM | POA: Diagnosis not present

## 2016-01-04 ENCOUNTER — Ambulatory Visit (HOSPITAL_BASED_OUTPATIENT_CLINIC_OR_DEPARTMENT_OTHER): Payer: BLUE CROSS/BLUE SHIELD

## 2016-02-05 ENCOUNTER — Encounter: Payer: BLUE CROSS/BLUE SHIELD | Admitting: Family Medicine

## 2016-02-23 ENCOUNTER — Encounter: Payer: BLUE CROSS/BLUE SHIELD | Admitting: Family Medicine

## 2016-02-23 ENCOUNTER — Ambulatory Visit (HOSPITAL_BASED_OUTPATIENT_CLINIC_OR_DEPARTMENT_OTHER)
Admission: RE | Admit: 2016-02-23 | Discharge: 2016-02-23 | Disposition: A | Discharge status: Home / Self Care | Payer: BLUE CROSS/BLUE SHIELD | Source: Ambulatory Visit | Attending: Family Medicine | Admitting: Family Medicine

## 2016-02-23 DIAGNOSIS — Z1231 Encounter for screening mammogram for malignant neoplasm of breast: Secondary | ICD-10-CM | POA: Insufficient documentation

## 2016-08-09 DIAGNOSIS — Z23 Encounter for immunization: Secondary | ICD-10-CM | POA: Diagnosis not present

## 2016-12-19 ENCOUNTER — Ambulatory Visit (INDEPENDENT_AMBULATORY_CARE_PROVIDER_SITE_OTHER): Payer: BLUE CROSS/BLUE SHIELD | Admitting: Family Medicine

## 2016-12-19 ENCOUNTER — Encounter: Payer: Self-pay | Admitting: Family Medicine

## 2016-12-19 VITALS — BP 120/80 | HR 66 | Temp 97.9°F | Ht 72.5 in | Wt 182.8 lb

## 2016-12-19 DIAGNOSIS — Z0000 Encounter for general adult medical examination without abnormal findings: Secondary | ICD-10-CM | POA: Diagnosis not present

## 2016-12-19 DIAGNOSIS — Z23 Encounter for immunization: Secondary | ICD-10-CM | POA: Diagnosis not present

## 2016-12-19 DIAGNOSIS — D229 Melanocytic nevi, unspecified: Secondary | ICD-10-CM

## 2016-12-19 LAB — POCT URINALYSIS DIPSTICK
BILIRUBIN UA: NEGATIVE
Blood, UA: NEGATIVE
Glucose, UA: NEGATIVE
KETONES UA: NEGATIVE
Leukocytes, UA: NEGATIVE
Nitrite, UA: NEGATIVE
PH UA: 6.5 (ref 5.0–8.0)
Protein, UA: NEGATIVE
SPEC GRAV UA: 1.015 (ref 1.010–1.025)
Urobilinogen, UA: 0.2 E.U./dL

## 2016-12-19 LAB — CBC WITH DIFFERENTIAL/PLATELET
BASOS PCT: 0.4 % (ref 0.0–3.0)
Basophils Absolute: 0 10*3/uL (ref 0.0–0.1)
EOS PCT: 1.1 % (ref 0.0–5.0)
Eosinophils Absolute: 0.1 10*3/uL (ref 0.0–0.7)
HCT: 39.9 % (ref 36.0–46.0)
Hemoglobin: 13.9 g/dL (ref 12.0–15.0)
LYMPHS ABS: 2.4 10*3/uL (ref 0.7–4.0)
Lymphocytes Relative: 38.5 % (ref 12.0–46.0)
MCHC: 34.8 g/dL (ref 30.0–36.0)
MCV: 87.4 fl (ref 78.0–100.0)
MONO ABS: 0.4 10*3/uL (ref 0.1–1.0)
Monocytes Relative: 5.9 % (ref 3.0–12.0)
NEUTROS PCT: 54.1 % (ref 43.0–77.0)
Neutro Abs: 3.4 10*3/uL (ref 1.4–7.7)
Platelets: 237 10*3/uL (ref 150.0–400.0)
RBC: 4.57 Mil/uL (ref 3.87–5.11)
RDW: 13 % (ref 11.5–15.5)
WBC: 6.3 10*3/uL (ref 4.0–10.5)

## 2016-12-19 LAB — LIPID PANEL
Cholesterol: 189 mg/dL (ref 0–200)
HDL: 56.5 mg/dL (ref >39.00)
LDL Cholesterol: 114 mg/dL — ABNORMAL HIGH (ref 0–99)
NONHDL: 132.19
Total CHOL/HDL Ratio: 3
Triglycerides: 92 mg/dL (ref 0.0–149.0)
VLDL: 18.4 mg/dL (ref 0.0–40.0)

## 2016-12-19 LAB — COMPREHENSIVE METABOLIC PANEL
ALK PHOS: 43 U/L (ref 39–117)
ALT: 15 U/L (ref 0–35)
AST: 18 U/L (ref 0–37)
Albumin: 4.5 g/dL (ref 3.5–5.2)
BUN: 10 mg/dL (ref 6–23)
CHLORIDE: 102 meq/L (ref 96–112)
CO2: 30 mEq/L (ref 19–32)
Calcium: 9.5 mg/dL (ref 8.4–10.5)
Creatinine, Ser: 0.92 mg/dL (ref 0.40–1.20)
GFR: 67.21 mL/min (ref >60.00)
GLUCOSE: 107 mg/dL — AB (ref 70–99)
POTASSIUM: 3.9 meq/L (ref 3.5–5.1)
SODIUM: 139 meq/L (ref 135–145)
TOTAL PROTEIN: 7.1 g/dL (ref 6.0–8.3)
Total Bilirubin: 0.6 mg/dL (ref 0.2–1.2)

## 2016-12-19 LAB — TSH: TSH: 1.61 u[IU]/mL (ref 0.35–4.50)

## 2016-12-19 NOTE — Progress Notes (Signed)
Subjective:     Ann Maddox is a 56 y.o. female and is here for a comprehensive physical exam. The patient reports no problems.  Social History   Social History  . Marital status: Married    Spouse name: N/A  . Number of children: N/A  . Years of education: N/A   Occupational History  . Not on file.   Social History Main Topics  . Smoking status: Never Smoker  . Smokeless tobacco: Never Used  . Alcohol use 1.8 oz/week    3 Glasses of wine per week     Comment: Red wine  . Drug use: No  . Sexual activity: Yes    Partners: Male   Other Topics Concern  . Not on file   Social History Narrative  . No narrative on file   Health Maintenance  Topic Date Due  . INFLUENZA VACCINE  03/01/2017  . COLONOSCOPY  06/18/2017  . PAP SMEAR  12/14/2017  . MAMMOGRAM  02/22/2018  . TETANUS/TDAP  08/14/2022  . Hepatitis C Screening  Completed  . HIV Screening  Completed    The following portions of the patient's history were reviewed and updated as appropriate:  She  has a past medical history of Allergy. She  does not have any pertinent problems on file. She  has a past surgical history that includes Other surgical history; Total hip arthroplasty; Bilateral anterior total hip arthroplasty (12/11  06/13); and Joint replacement. Her family history includes Cancer in her father; Diabetes in her father; Glaucoma in her mother; Heart disease in her father, paternal grandfather, and paternal uncle; Hyperlipidemia in her father and mother; Hypertension in her father and mother. She  reports that she has never smoked. She has never used smokeless tobacco. She reports that she drinks about 1.8 oz of alcohol per week . She reports that she does not use drugs. She has a current medication list which includes the following prescription(s): vitamin c, magnesium, multivitamin, multiple vitamins-minerals, NON FORMULARY, omega-3 fatty acids, probiotic product, and UNABLE TO FIND. Current Outpatient  Prescriptions on File Prior to Visit  Medication Sig Dispense Refill  . Magnesium 400 MG CAPS Take 2 capsules by mouth daily. Reported on 12/17/2015    . Multiple Vitamin (MULTIVITAMIN) tablet Take 1 tablet by mouth daily.    . Multiple Vitamins-Minerals (MAXIMUM DAILY GREEN PO) Take by mouth daily.    . NON FORMULARY Branch Chain Amino Acids    . Omega-3 Fatty Acids (OMEGA 3 PO) Take 2 capsules by mouth daily.    . Probiotic Product (PROBIOTIC DAILY PO) Take 3 capsules by mouth daily.    Marland Kitchen UNABLE TO FIND Med Name: Knute Neu Supplement and Adrenal Supplement     No current facility-administered medications on file prior to visit.    She is allergic to butorphanol tartrate; gluten meal; ketorolac; lactose; and other..  Review of Systems Review of Systems  Constitutional: Negative for activity change, appetite change and fatigue.  HENT: Negative for hearing loss, congestion, tinnitus and ear discharge.  dentist q33m Eyes: Negative for visual disturbance (see optho q1y -- vision corrected to 20/20 with glasses).  Respiratory: Negative for cough, chest tightness and shortness of breath.   Cardiovascular: Negative for chest pain, palpitations and leg swelling.  Gastrointestinal: Negative for abdominal pain, diarrhea, constipation and abdominal distention.  Genitourinary: Negative for urgency, frequency, decreased urine volume and difficulty urinating.  Musculoskeletal: Negative for back pain, arthralgias and gait problem.  Skin: Negative for color change, pallor  and rash.  Neurological: Negative for dizziness, light-headedness, numbness and headaches.  Hematological: Negative for adenopathy. Does not bruise/bleed easily.  Psychiatric/Behavioral: Negative for suicidal ideas, confusion, sleep disturbance, self-injury, dysphoric mood, decreased concentration and agitation.         Objective:    BP 120/80 (BP Location: Left Arm, Patient Position: Sitting, Cuff Size: Normal)   Pulse 66    Temp 97.9 F (36.6 C) (Oral)   Ht 6' 0.5" (1.842 m)   Wt 182 lb 12.8 oz (82.9 kg)   LMP 06/04/2012   SpO2 99%   BMI 24.45 kg/m  General appearance: alert, cooperative, appears stated age and no distress Head: Normocephalic, without obvious abnormality, atraumatic Eyes: conjunctivae/corneas clear. PERRL, EOM's intact. Fundi benign. Ears: normal TM's and external ear canals both ears Nose: Nares normal. Septum midline. Mucosa normal. No drainage or sinus tenderness. Throat: lips, mucosa, and tongue normal; teeth and gums normal Neck: no adenopathy, no carotid bruit, no JVD, supple, symmetrical, trachea midline and thyroid not enlarged, symmetric, no tenderness/mass/nodules Back: symmetric, no curvature. ROM normal. No CVA tenderness. Lungs: clear to auscultation bilaterally Breasts: normal appearance, no masses or tenderness Heart: regular rate and rhythm, S1, S2 normal, no murmur, click, rub or gallop Abdomen: soft, non-tender; bowel sounds normal; no masses,  no organomegaly Pelvic: deferred Extremities: extremities normal, atraumatic, no cyanosis or edema Pulses: 2+ and symmetric Skin: Skin color, texture, turgor normal. No rashes or lesions Lymph nodes: Cervical, supraclavicular, and axillary nodes normal. Neurologic: Alert and oriented X 3, normal strength and tone. Normal symmetric reflexes. Normal coordination and gait    Assessment:    Healthy female exam.      Plan:    ghm utd--  Check labs  See After Visit Summary for Counseling Recommendations    1. Atypical mole  - Ambulatory referral to Dermatology  2. Preventative health care See above - Lipid panel - Comprehensive metabolic panel - CBC with Differential/Platelet - TSH - POCT urinalysis dipstick  3. Need for zoster vaccine   - Varicella-zoster vaccine IM     Patient ID: Ann Maddox, female   DOB: 1961/02/05, 56 y.o.   MRN: [id number]

## 2016-12-19 NOTE — Patient Instructions (Signed)

## 2017-01-16 ENCOUNTER — Telehealth: Payer: Self-pay | Admitting: Family Medicine

## 2017-01-16 NOTE — Telephone Encounter (Signed)
Patient called requesting printed copy of labs from 12/19/16. Placed at front office for pick up

## 2017-03-23 DIAGNOSIS — H40013 Open angle with borderline findings, low risk, bilateral: Secondary | ICD-10-CM | POA: Diagnosis not present

## 2017-03-23 DIAGNOSIS — H47323 Drusen of optic disc, bilateral: Secondary | ICD-10-CM | POA: Diagnosis not present

## 2017-04-12 DIAGNOSIS — H5213 Myopia, bilateral: Secondary | ICD-10-CM | POA: Diagnosis not present

## 2017-05-10 DIAGNOSIS — Z888 Allergy status to other drugs, medicaments and biological substances status: Secondary | ICD-10-CM | POA: Diagnosis not present

## 2017-05-10 DIAGNOSIS — Z96643 Presence of artificial hip joint, bilateral: Secondary | ICD-10-CM | POA: Diagnosis not present

## 2017-05-10 DIAGNOSIS — Z886 Allergy status to analgesic agent status: Secondary | ICD-10-CM | POA: Diagnosis not present

## 2017-05-10 DIAGNOSIS — Z471 Aftercare following joint replacement surgery: Secondary | ICD-10-CM | POA: Diagnosis not present

## 2017-07-03 ENCOUNTER — Encounter: Payer: Self-pay | Admitting: Internal Medicine

## 2017-07-20 DIAGNOSIS — F4320 Adjustment disorder, unspecified: Secondary | ICD-10-CM | POA: Diagnosis not present

## 2017-07-20 DIAGNOSIS — J309 Allergic rhinitis, unspecified: Secondary | ICD-10-CM | POA: Diagnosis not present

## 2017-07-20 DIAGNOSIS — M1990 Unspecified osteoarthritis, unspecified site: Secondary | ICD-10-CM | POA: Diagnosis not present

## 2017-08-09 ENCOUNTER — Telehealth: Payer: Self-pay | Admitting: *Deleted

## 2017-08-09 NOTE — Telephone Encounter (Signed)
Received request for Medical Records from [facility]; forwarded to [place] for email/scan/SLS 01/09

## 2017-08-16 DIAGNOSIS — Z86018 Personal history of other benign neoplasm: Secondary | ICD-10-CM | POA: Diagnosis not present

## 2017-08-16 DIAGNOSIS — L814 Other melanin hyperpigmentation: Secondary | ICD-10-CM | POA: Diagnosis not present

## 2017-08-16 DIAGNOSIS — L821 Other seborrheic keratosis: Secondary | ICD-10-CM | POA: Diagnosis not present

## 2017-08-16 DIAGNOSIS — D1801 Hemangioma of skin and subcutaneous tissue: Secondary | ICD-10-CM | POA: Diagnosis not present

## 2017-10-26 ENCOUNTER — Telehealth: Payer: Self-pay | Admitting: *Deleted

## 2017-10-26 NOTE — Telephone Encounter (Signed)
Spoke with pt and scheduled 2nd Shingrix vaccine for 11/02/17.

## 2017-11-02 ENCOUNTER — Ambulatory Visit: Payer: BLUE CROSS/BLUE SHIELD

## 2017-11-07 ENCOUNTER — Ambulatory Visit (INDEPENDENT_AMBULATORY_CARE_PROVIDER_SITE_OTHER): Payer: BLUE CROSS/BLUE SHIELD | Admitting: *Deleted

## 2017-11-07 DIAGNOSIS — Z23 Encounter for immunization: Secondary | ICD-10-CM | POA: Diagnosis not present

## 2017-11-07 NOTE — Progress Notes (Signed)
Pre visit review using our clinic review tool, if applicable. No additional management support is needed unless otherwise documented below in the visit note.  Pt here for 2nd shingrix vaccine per Dr Carollee Herter.  Shingrix 0.22mL given IM, left deltoid and pt tolerated injection well.

## 2017-12-21 ENCOUNTER — Encounter: Payer: BLUE CROSS/BLUE SHIELD | Admitting: Family Medicine

## 2017-12-28 ENCOUNTER — Ambulatory Visit: Payer: BLUE CROSS/BLUE SHIELD | Admitting: Medical

## 2017-12-28 ENCOUNTER — Encounter: Payer: Self-pay | Admitting: Medical

## 2017-12-28 VITALS — BP 131/81 | HR 69 | Temp 98.7°F | Resp 16 | Ht 72.0 in | Wt 186.4 lb

## 2017-12-28 DIAGNOSIS — J0100 Acute maxillary sinusitis, unspecified: Secondary | ICD-10-CM | POA: Diagnosis not present

## 2017-12-28 DIAGNOSIS — J301 Allergic rhinitis due to pollen: Secondary | ICD-10-CM

## 2017-12-28 DIAGNOSIS — J40 Bronchitis, not specified as acute or chronic: Secondary | ICD-10-CM | POA: Diagnosis not present

## 2017-12-28 MED ORDER — AZITHROMYCIN 250 MG PO TABS: ORAL_TABLET | ORAL | 0 refills | Status: DC

## 2017-12-28 MED ORDER — BENZONATATE 100 MG PO CAPS: 100.0000 mg | ORAL_CAPSULE | Freq: Three times a day (TID) | ORAL | 0 refills | Status: DC | PRN

## 2017-12-28 MED ORDER — FLUCONAZOLE 150 MG PO TABS: 150.0000 mg | ORAL_TABLET | Freq: Once | ORAL | 0 refills | Status: AC

## 2017-12-28 MED ORDER — METHYLPREDNISOLONE ACETATE 40 MG/ML IJ SUSP
40.0000 mg | Freq: Once | INTRAMUSCULAR | Status: AC
  Administered 2017-12-28: 40 mg via INTRAMUSCULAR

## 2017-12-28 NOTE — Patient Instructions (Signed)
You appear to have bronchitis and sinusitis following allergic rhinitis symptoms. Rest hydrate and tylenol for fever. I am prescribing cough medicine benzonatate, and azithromycin antibiotic.   We gave you depomedrol 40 mg im. Continue nasocort.  You should gradually get better. If not notify us. If you need time off next week please let me know.  Follow up in 7-10 days or as needed

## 2017-12-28 NOTE — Progress Notes (Signed)
Subjective:    Patient ID: Ann Maddox, female    DOB: 06-Apr-1961, 57 y.o.   MRN: [id number]  HPI  Pt in for evaluation.  Pt describes she is overworked. She is taking over duties of various person. No time to rest. On vacation but working. Expressing unable to rest to recover from recent illness.  She has some severe allergy symptoms for 3 weeks. Had nasal congestion, runny nose, pnd, and sinus pressure. Now starting to get some chest congestion and recurrent cough.  Ears filled plugged.   Describes trying various otc conservative measures. In addition, tried otc anthistimanes in addition to nasocort.  In morning will do netty pott and then can blow out mucus.    Review of Systems  Constitutional: Positive for fatigue. Negative for chills and fever.  HENT: Positive for congestion, postnasal drip, sinus pressure and sinus pain. Negative for ear pain.   Respiratory: Positive for cough. Negative for chest tightness, shortness of breath and wheezing.   Cardiovascular: Negative for chest pain and palpitations.  Gastrointestinal: Negative for abdominal pain.  Musculoskeletal: Negative for back pain.  Hematological: Negative for adenopathy. Does not bruise/bleed easily.   Past Medical History:  Diagnosis Date  . Allergy      Social History   Socioeconomic History  . Marital status: Married    Spouse name: Not on file  . Number of children: Not on file  . Years of education: Not on file  . Highest education level: Not on file  Occupational History  . Not on file  Social Needs  . Financial resource strain: Not on file  . Food insecurity:    Worry: Not on file    Inability: Not on file  . Transportation needs:    Medical: Not on file    Non-medical: Not on file  Tobacco Use  . Smoking status: Never Smoker  . Smokeless tobacco: Never Used  Substance and Sexual Activity  . Alcohol use: Yes    Alcohol/week: 1.8 oz    Types: 3 Glasses of wine per week    Comment: Red  wine  . Drug use: No  . Sexual activity: Yes    Partners: Male  Lifestyle  . Physical activity:    Days per week: Not on file    Minutes per session: Not on file  . Stress: Not on file  Relationships  . Social connections:    Talks on phone: Not on file    Gets together: Not on file    Attends religious service: Not on file    Active member of club or organization: Not on file    Attends meetings of clubs or organizations: Not on file    Relationship status: Not on file  . Intimate partner violence:    Fear of current or ex partner: Not on file    Emotionally abused: Not on file    Physically abused: Not on file    Forced sexual activity: Not on file  Other Topics Concern  . Not on file  Social History Narrative  . Not on file    Past Surgical History:  Procedure Laterality Date  . BILATERAL ANTERIOR TOTAL HIP ARTHROPLASTY  12/11  06/13  . JOINT REPLACEMENT     second hip 2013  . OTHER SURGICAL HISTORY     upper jaw sx  . TOTAL HIP ARTHROPLASTY     bil.    Family History  Problem Relation Age of Onset  . Hypertension Mother   .  Hyperlipidemia Mother   . Glaucoma Mother   . Hypertension Father   . Hyperlipidemia Father   . Diabetes Father        secondary to pancreatic ca  . Cancer Father        pancreatic can  . Heart disease Father        stent  . Heart disease Paternal Uncle   . Heart disease Paternal Grandfather        MI  . Colon cancer Neg Hx     Allergies  Allergen Reactions  . Butorphanol Tartrate Nausea Only  . Gluten Meal Nausea And Vomiting  . Ketorolac Nausea Only  . Lactose Other (See Comments)    unknown  . Other Other (See Comments)    Mold/ trees/ ragweed/   unknown    Current Outpatient Medications on File Prior to Visit  Medication Sig Dispense Refill  . Ascorbic Acid (VITAMIN C) 1000 MG tablet Take 1,000 mg by mouth daily.    . Magnesium 400 MG CAPS Take 2 capsules by mouth daily. Reported on 12/17/2015    . Multiple Vitamin  (MULTIVITAMIN) tablet Take 1 tablet by mouth daily.    . Multiple Vitamins-Minerals (MAXIMUM DAILY GREEN PO) Take by mouth daily.    . NON FORMULARY Branch Chain Amino Acids    . Omega-3 Fatty Acids (OMEGA 3 PO) Take 2 capsules by mouth daily.    . Probiotic Product (PROBIOTIC DAILY PO) Take 3 capsules by mouth daily.    Marland Kitchen UNABLE TO FIND Med Name: Knute Neu Supplement and Adrenal Supplement     No current facility-administered medications on file prior to visit.     BP 131/81   Pulse 69   Temp 98.7 F (37.1 C) (Oral)   Resp 16   Ht 6' (1.829 m)   Wt 186 lb 6.4 oz (84.6 kg)   LMP 06/04/2012   SpO2 99%   BMI 25.28 kg/m      Objective:   Physical Exam  General  Mental Status - Alert. General Appearance - Well groomed. Not in acute distress.  Skin Rashes- No Rashes.  HEENT Head- Normal. Ear Auditory Canal - Left- Normal. Right - Normal.Tympanic Membrane- Left- Normal. Right- Normal. Eye Sclera/Conjunctiva- Left- Normal. Right- Normal. Nose & Sinuses Nasal Mucosa- Left-  Boggy and Congested. Right-  Boggy and  Congested.Bilateral maxillary and frontal sinus pressure. Mouth & Throat Lips: Upper Lip- Normal: no dryness, cracking, pallor, cyanosis, or vesicular eruption. Lower Lip-Normal: no dryness, cracking, pallor, cyanosis or vesicular eruption. Buccal Mucosa- Bilateral- No Aphthous ulcers. Oropharynx- No Discharge or Erythema. Tonsils: Characteristics- Bilateral- No Erythema or Congestion. Size/Enlargement- Bilateral- No enlargement. Discharge- bilateral-None.  Neck Neck- Supple. No Masses.   Chest and Lung Exam Auscultation: Breath Sounds:-Clear even and unlabored.  Cardiovascular Auscultation:Rythm- Regular, rate and rhythm. Murmurs & Other Heart Sounds:Ausculatation of the heart reveal- No Murmurs.  Lymphatic Head & Neck General Head & Neck Lymphatics: Bilateral: Description- No Localized lymphadenopathy.       Assessment & Plan:  You appear to  have bronchitis and sinusitis following allergic rhinitis symptoms. Rest hydrate and tylenol for fever. I am prescribing cough medicine benzonatate, and azithromycin antibiotic.   We gave you depomedrol 40 mg im. Continue nasocort.  You should gradually get better. If not notify us. If you need time off next week please let me know.  Follow up in 7-10 days or as needed  [employer], [employer]

## 2018-01-01 ENCOUNTER — Telehealth: Payer: Self-pay | Admitting: Family Medicine

## 2018-01-01 MED ORDER — PREDNISONE 10 MG PO TABS: ORAL_TABLET | ORAL | 0 refills | Status: DC

## 2018-01-01 MED ORDER — DOXYCYCLINE HYCLATE 100 MG PO TABS
100.0000 mg | ORAL_TABLET | Freq: Two times a day (BID) | ORAL | 0 refills | Status: DC
Start: 2018-01-01 — End: 2018-04-06

## 2018-01-01 NOTE — Telephone Encounter (Signed)
Copied from [place] [id number]. Topic: Quick Communication - See Telephone Encounter >> Jan 01, 2018  7:42 AM Nils Flack wrote: CRM for notification. See Telephone encounter for: 01/01/18. Pt was in on thurs - she is not feeling better.  Still has cough.  Pt was told that Percell Miller would call in prednisone. She is also asking for Doxycycline instead of Z pack.  Pt is also asking for work note to be out for Fri, Mon, Tues, Wed. Pharm - [facility],  Cb is [phone]

## 2018-01-01 NOTE — Telephone Encounter (Signed)
Notified pt and rx sent yo pharmacy.

## 2018-01-01 NOTE — Telephone Encounter (Signed)
I am going to write pt a note for her to be out until January 04, 2018. Will you print that and call patient to see if she has fax number to work note to.   Stop azithromycin and start doxycycline. Also fax prednisone rx to her pharmacy.

## 2018-03-28 ENCOUNTER — Ambulatory Visit (AMBULATORY_SURGERY_CENTER): Payer: Self-pay | Admitting: *Deleted

## 2018-03-28 VITALS — Ht 72.5 in | Wt 186.0 lb

## 2018-03-28 DIAGNOSIS — Z1211 Encounter for screening for malignant neoplasm of colon: Secondary | ICD-10-CM

## 2018-03-28 MED ORDER — NA SULFATE-K SULFATE-MG SULF 17.5-3.13-1.6 GM/177ML PO SOLN: 1.0000 | Freq: Once | ORAL | 0 refills | Status: AC

## 2018-03-28 NOTE — Progress Notes (Signed)
No egg or soy allergy known to patient  No issues with past sedation with any surgeries  or procedures, no intubation problems  No diet pills per patient No home 02 use per patient  No blood thinners per patient  Pt denies issues with constipation  No A fib or A flutter  EMMI video sent to pt's e mail pt declined   

## 2018-03-29 ENCOUNTER — Encounter: Payer: Self-pay | Admitting: Internal Medicine

## 2018-04-03 ENCOUNTER — Encounter: Payer: Self-pay | Admitting: Family Medicine

## 2018-04-03 DIAGNOSIS — Z1231 Encounter for screening mammogram for malignant neoplasm of breast: Secondary | ICD-10-CM | POA: Diagnosis not present

## 2018-04-03 LAB — HM MAMMOGRAPHY

## 2018-04-06 ENCOUNTER — Ambulatory Visit (AMBULATORY_SURGERY_CENTER): Payer: BLUE CROSS/BLUE SHIELD | Admitting: Internal Medicine

## 2018-04-06 ENCOUNTER — Encounter: Payer: Self-pay | Admitting: Internal Medicine

## 2018-04-06 VITALS — BP 152/83 | HR 59 | Temp 97.8°F | Resp 12 | Ht 72.0 in | Wt 186.0 lb

## 2018-04-06 DIAGNOSIS — Z1211 Encounter for screening for malignant neoplasm of colon: Secondary | ICD-10-CM

## 2018-04-06 MED ORDER — SODIUM CHLORIDE 0.9 % IV SOLN: 500.0000 mL | Freq: Once | INTRAVENOUS | Status: DC

## 2018-04-06 NOTE — Progress Notes (Signed)
Report to PACU, RN, vss, BBS= Clear.  

## 2018-04-06 NOTE — Patient Instructions (Signed)
Impression/Recommendations:  Resume previous diet. Continue present medications.  Repeat colonoscopy in 10 years for screening.  YOU HAD AN ENDOSCOPIC PROCEDURE TODAY AT [facility]:   Refer to the procedure report that was given to you for any specific questions about what was found during the examination.  If the procedure report does not answer your questions, please call your gastroenterologist to clarify.  If you requested that your care partner not be given the details of your procedure findings, then the procedure report has been included in a sealed envelope for you to review at your convenience later.  YOU SHOULD EXPECT: Some feelings of bloating in the abdomen. Passage of more gas than usual.  Walking can help get rid of the air that was put into your GI tract during the procedure and reduce the bloating. If you had a lower endoscopy (such as a colonoscopy or flexible sigmoidoscopy) you may notice spotting of blood in your stool or on the toilet paper. If you underwent a bowel prep for your procedure, you may not have a normal bowel movement for a few days.  Please Note:  You might notice some irritation and congestion in your nose or some drainage.  This is from the oxygen used during your procedure.  There is no need for concern and it should clear up in a day or so.  SYMPTOMS TO REPORT IMMEDIATELY:   Following lower endoscopy (colonoscopy or flexible sigmoidoscopy):  Excessive amounts of blood in the stool  Significant tenderness or worsening of abdominal pains  Swelling of the abdomen that is new, acute  Fever of 100F or higher For urgent or emergent issues, a gastroenterologist can be reached at any hour by calling [phone].   DIET:  We do recommend a small meal at first, but then you may proceed to your regular diet.  Drink plenty of fluids but you should avoid alcoholic beverages for 24 hours.  ACTIVITY:  You should plan to take it easy for the rest  of today and you should NOT DRIVE or use heavy machinery until tomorrow (because of the sedation medicines used during the test).    FOLLOW UP: Our staff will call the number listed on your records the next business day following your procedure to check on you and address any questions or concerns that you may have regarding the information given to you following your procedure. If we do not reach you, we will leave a message.  However, if you are feeling well and you are not experiencing any problems, there is no need to return our call.  We will assume that you have returned to your regular daily activities without incident.  If any biopsies were taken you will be contacted by phone or by letter within the next 1-3 weeks.  Please call us at [phone] if you have not heard about the biopsies in 3 weeks.    SIGNATURES/CONFIDENTIALITY: You and/or your care partner have signed paperwork which will be entered into your electronic medical record.  These signatures attest to the fact that that the information above on your After Visit Summary has been reviewed and is understood.  Full responsibility of the confidentiality of this discharge information lies with you and/or your care-partner.

## 2018-04-06 NOTE — Op Note (Signed)
Ann Maddox: Ann Maddox Procedure Date: 04/06/2018 10:54 AM MRN: [id number] Endoscopist: Jerene Bears , MD Age: 57 Referring MD:  Date of Birth: Aug 31, 1960 Gender: Female Account #: [id number] Procedure:                Colonoscopy Indications:              Screening for colorectal malignant neoplasm Medicines:                Monitored Anesthesia Care Procedure:                Pre-Anesthesia Assessment:                           - Prior to the procedure, a History and Physical                            was performed, and patient medications and                            allergies were reviewed. The patient's tolerance of                            previous anesthesia was also reviewed. The risks                            and benefits of the procedure and the sedation                            options and risks were discussed with the patient.                            All questions were answered, and informed consent                            was obtained. Prior Anticoagulants: The patient has                            taken no previous anticoagulant or antiplatelet                            agents. ASA Grade Assessment: II - A patient with                            mild systemic disease. After reviewing the risks                            and benefits, the patient was deemed in                            satisfactory condition to undergo the procedure.                           After obtaining informed consent, the colonoscope  was passed under direct vision. Throughout the                            procedure, the patient's blood pressure, pulse, and                            oxygen saturations were monitored continuously. The                            Colonoscope was introduced through the anus and                            advanced to the terminal ileum. The colonoscopy was                            performed without  difficulty, though colon is                            redundant. The patient tolerated the procedure                            well. The quality of the bowel preparation was                            good. The terminal ileum, ileocecal valve,                            appendiceal orifice, and rectum were photographed.                            The bowel preparation used was 2 day MiraLax +                            SUPREP. Scope In: 11:05:40 AM Scope Out: 11:22:21 AM Scope Withdrawal Time: 0 hours 10 minutes 15 seconds  Total Procedure Duration: 0 hours 16 minutes 41 seconds  Findings:                 The terminal ileum appeared normal.                           The colon (entire examined portion) appeared normal.                           Anal papillae were hypertrophied on retroflexed                            views within the rectum. Retroflexion otherwise                            normal. Complications:            No immediate complications. Estimated Blood Loss:     Estimated blood loss: none. Impression:               - The examined portion of the ileum was  normal.                           - The entire examined colon is normal.                           - Anal papilla(e) were hypertrophied.                           - No specimens collected. Recommendation:           - Patient has a contact number available for                            emergencies. The signs and symptoms of potential                            delayed complications were discussed with the                            patient. Return to normal activities tomorrow.                            Written discharge instructions were provided to the                            patient.                           - Resume previous diet.                           - Continue present medications.                           - If dietary modifications or nutritional                            supplements fail to improve  chronic constipation,                            please contact me if you are interested in trying a                            prescription laxative.                           - Repeat colonoscopy in 10 years for screening                            purposes. Jerene Bears, MD 04/06/2018 11:29:55 AM This report has been signed electronically. CC Letter to:             Santiago Glad L. Darron Doom

## 2018-04-09 ENCOUNTER — Telehealth: Payer: Self-pay | Admitting: *Deleted

## 2018-04-09 NOTE — Telephone Encounter (Signed)
  Follow up Call-  Call back number 04/06/2018  Post procedure Call Back phone  # [phone]  Permission to leave phone message Yes  Some recent data might be hidden     Patient questions:  Do you have a fever, pain , or abdominal swelling? No. Pain Score  0 *  Have you tolerated food without any problems? Yes.    Have you been able to return to your normal activities? Yes.    Do you have any questions about your discharge instructions: Diet   No. Medications  No. Follow up visit  No.  Do you have questions or concerns about your Care? No.  Actions: * If pain score is 4 or above: No action needed, pain <4.

## 2018-04-09 NOTE — Telephone Encounter (Signed)
  Follow up Call-  Call back number 04/06/2018  Post procedure Call Back phone  # [phone]  Permission to leave phone message Yes  Some recent data might be hidden     No answer at # given.  LM on VM.

## 2018-04-19 ENCOUNTER — Encounter: Payer: Self-pay | Admitting: Family Medicine

## 2018-04-25 DIAGNOSIS — Z23 Encounter for immunization: Secondary | ICD-10-CM | POA: Diagnosis not present

## 2019-03-08 ENCOUNTER — Other Ambulatory Visit: Payer: Self-pay | Admitting: Family Medicine

## 2019-03-08 ENCOUNTER — Telehealth: Payer: Self-pay | Admitting: Family Medicine

## 2019-03-08 DIAGNOSIS — J302 Other seasonal allergic rhinitis: Secondary | ICD-10-CM

## 2019-03-08 MED ORDER — CETIRIZINE HCL 10 MG PO TABS: 10.0000 mg | ORAL_TABLET | Freq: Every day | ORAL | 1 refills | Status: AC

## 2019-03-08 NOTE — Telephone Encounter (Signed)
Zyrtec printed  6 months-

## 2019-03-08 NOTE — Telephone Encounter (Signed)
Please advise 

## 2019-03-08 NOTE — Telephone Encounter (Signed)
Patient requesting a call back from [place] to discuss whether she is able to get a printed RX for Zyrtec. Patient states that she has seasonal allergies. Patient declined appointment.

## 2019-05-23 ENCOUNTER — Other Ambulatory Visit: Payer: Self-pay

## 2019-05-24 ENCOUNTER — Encounter: Payer: Self-pay | Admitting: Family Medicine

## 2019-05-24 ENCOUNTER — Ambulatory Visit: Payer: BLUE CROSS/BLUE SHIELD | Admitting: Family Medicine

## 2019-05-24 VITALS — BP 130/80 | HR 62 | Temp 97.9°F

## 2019-05-24 DIAGNOSIS — R221 Localized swelling, mass and lump, neck: Secondary | ICD-10-CM

## 2019-05-24 DIAGNOSIS — E049 Nontoxic goiter, unspecified: Secondary | ICD-10-CM | POA: Diagnosis not present

## 2019-05-24 NOTE — Patient Instructions (Signed)
Goiter  A goiter is an enlarged thyroid gland. The thyroid is located in the lower front of the neck. It makes hormones that affect many body parts and systems, including the system that affects how quickly the body burns fuel for energy (metabolism). Most goiters are painless and are not a cause for concern. Some goiters can affect the way your thyroid makes thyroid hormones. Goiters and conditions that cause goiters can be treated, if necessary. What are the causes? Common causes of this condition include:  Lack (deficiency) of a mineral called iodine. The thyroid gland uses iodine to make thyroid hormones.  Diseases that attack healthy cells in the body (autoimmune diseases) and affect thyroid function, such as Graves' disease or Hashimoto's disease. These diseases may cause the body to produce too much thyroid hormone (hyperthyroidism) or too little of the hormone (hypothyroidism).  Conditions that cause inflammation of the thyroid (thyroiditis).  One or more small growths on the thyroid (nodular goiter). Other causes include:  Medical problems caused by abnormal genes that are passed from parent to child (genetic defects).  Thyroid injury or infection.  Tumors that may or may not be cancerous.  Pregnancy.  Certain medicines.  Exposure to radiation. In some cases, the cause may not be known. What increases the risk? This condition is more likely to develop in:  People who do not get enough iodine in their diet.  People who have a family history of goiter.  Women.  People who are older than age 40.  People who smoke tobacco.  People who have had exposure to radiation. What are the signs or symptoms? The main symptom of this condition is swelling in the lower, front part of the neck. This swelling can range from a very small bump to a large lump. Other symptoms may include:  A tight feeling in the throat.  A hoarse voice.  Coughing.  Wheezing.  Difficulty  swallowing or breathing.  Bulging veins in the neck.  Dizziness. When a goiter is the result of an overactive thyroid (hyperthyroidism), symptoms may also include:  Nervousness or restlessness.  Inability to tolerate heat.  Unexplained weight loss.  Diarrhea.  Change in the texture of hair or skin.  Changes in heartbeat, such as skipped beats, extra beats, or a rapid heart rate.  Loss of menstruation.  Shaky hands.  Increased appetite.  Sleep problems. When a goiter is the result of an underactive thyroid (hypothyroidism), symptoms may also include:  Feeling like you have no energy (lethargy).  Inability to tolerate cold.  Weight gain that is not explained by a change in diet or exercise habits.  Dry skin.  Coarse hair.  Irregular menstrual periods.  Constipation.  Sadness or depression.  Fatigue. In some cases, there may not be any symptoms and the thyroid hormone levels may be normal. How is this diagnosed? This condition may be diagnosed based on your symptoms, your medical history, and a physical exam. You may have tests, such as:  Blood tests to check thyroid function.  Imaging tests, such as: ? Ultrasound. ? CT scan. ? MRI. ? Thyroid scan.  Removal of a tissue sample (biopsy) of the goiter or any nodules. The sample will be tested to check for cancer. How is this treated? Treatment for this condition depends on the cause and your symptoms. Treatment may include:  Medicines to regulate thyroid hormone levels.  Anti-inflammatory medicines or steroid medicines, if the goiter is caused by inflammation.  Iodine supplements or changes to your   diet, if the goiter is caused by iodine deficiency.  Radioactive iodine treatment.  Surgery to remove your thyroid. In some cases, you may only need regular check-ups with your health care provider to monitor your condition, and you may not need treatment. Follow these instructions at home:  Follow  instructions from your health care provider about any changes to your diet.  Take over-the-counter and prescription medicines only as told by your health care provider. These include supplements.  Do not use any products that contain nicotine or tobacco, such as cigarettes and e-cigarettes. If you need help quitting, ask your health care provider.  Keep all follow-up visits as told by your health care provider. This is important. Contact a health care provider if:  Your symptoms do not get better with treatment.  You have nausea, vomiting, or diarrhea. Get help right away if:  You have sudden, unexplained confusion or other mental changes.  You have a fever.  You have chest pain.  You have trouble breathing or swallowing.  You suddenly become very weak.  You experience extreme restlessness.  You feel your heart racing. Summary  A goiter is an enlarged thyroid gland.  The thyroid gland is located in the lower front of the neck. It makes hormones that affect many body parts and systems, including the system that affects how quickly the body burns fuel for energy (metabolism).  The main symptom of this condition is swelling in the lower, front part of the neck. This swelling can range from a very small bump to a large lump.  Treatment for this condition depends on the cause and your symptoms. You may need medicines, supplements, or regular monitoring of your condition. This information is not intended to replace advice given to you by your health care provider. Make sure you discuss any questions you have with your health care provider. Document Released: 01/05/2010 Document Revised: 06/30/2017 Document Reviewed: 04/13/2017 Elsevier Patient Education  2020 [employer]  

## 2019-05-24 NOTE — Progress Notes (Signed)
Patient ID: Ann Maddox, female    DOB: 10-Nov-1960  Age: 58 y.o. MRN: [id number]    Subjective:  Subjective  HPI Ann Maddox presents for swelling in her neck x 1 month   Review of Systems  Constitutional: Negative for appetite change, diaphoresis, fatigue and unexpected weight change.  Eyes: Negative for pain, redness and visual disturbance.  Respiratory: Negative for cough, chest tightness, shortness of breath and wheezing.   Cardiovascular: Negative for chest pain, palpitations and leg swelling.  Endocrine: Negative for cold intolerance, heat intolerance, polydipsia, polyphagia and polyuria.  Genitourinary: Negative for difficulty urinating, dysuria and frequency.  Neurological: Negative for dizziness, light-headedness, numbness and headaches.    History Past Medical History:  Diagnosis Date  . Allergy   . Arthritis    hips, shoulders  . 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   Her family history includes Cancer in her father; Diabetes in her father; Glaucoma in her mother; Heart disease in her father, paternal grandfather, and paternal uncle; Hyperlipidemia in her father and mother; Hypertension in her father and mother.She reports that she has never smoked. She has never used smokeless tobacco. She reports current alcohol use of about 3.0 standard drinks of alcohol per week. She reports that she does not use drugs.  Current Outpatient Medications on File Prior to Visit  Medication Sig Dispense Refill  . Ascorbic Acid (VITAMIN C) 1000 MG tablet Take 1,000 mg by mouth daily.    . cetirizine (ZYRTEC) 10 MG tablet Take 1 tablet (10 mg total) by mouth daily. 90 tablet 1  . Magnesium 400 MG CAPS Take 2 capsules by mouth daily. Reported on  12/17/2015    . Multiple Vitamin (MULTIVITAMIN) tablet Take 1 tablet by mouth daily.    . Multiple Vitamins-Minerals (MAXIMUM DAILY GREEN PO) Take by mouth daily.    . NON FORMULARY Branch Chain Amino Acids    . Omega-3 Fatty Acids (OMEGA 3 PO) Take 2 capsules by mouth daily.    . Probiotic Product (PROBIOTIC DAILY PO) Take 3 capsules by mouth daily.    Marland Kitchen UNABLE TO FIND Med Name: Knute Neu Supplement and Adrenal Supplement     No current facility-administered medications on file prior to visit.      Objective:  Objective  Physical Exam Vitals signs and nursing note reviewed.  Constitutional:      Appearance: She is well-developed.  HENT:     Head: Normocephalic and atraumatic.  Eyes:     Conjunctiva/sclera: Conjunctivae normal.  Neck:     Musculoskeletal: Normal range of motion and neck supple.     Thyroid: Thyromegaly present.     Vascular: No carotid bruit or JVD.  Cardiovascular:     Rate and Rhythm: Normal rate and regular rhythm.     Heart sounds: Normal heart sounds. No murmur.  Pulmonary:     Effort: Pulmonary effort is normal. No respiratory distress.     Breath sounds: Normal breath sounds. No wheezing or rales.  Chest:     Chest wall: No tenderness.  Neurological:     Mental Status: She is alert and oriented to person, place, and time.    BP 130/80 (BP Location: Left Arm, Patient Position: Sitting, Cuff Size: Normal)   Pulse 62  Temp 97.9 F (36.6 C) (Temporal)   LMP 06/04/2012   SpO2 95%  Wt Readings from Last 3 Encounters:  04/06/18 186 lb (84.4 kg)  03/28/18 186 lb (84.4 kg)  12/28/17 186 lb 6.4 oz (84.6 kg)     Lab Results  Component Value Date   WBC 6.3 12/19/2016   HGB 13.9 12/19/2016   HCT 39.9 12/19/2016   PLT 237.0 12/19/2016   GLUCOSE 107 (H) 12/19/2016   CHOL 189 12/19/2016   TRIG 92.0 12/19/2016   HDL 56.50 12/19/2016   LDLCALC 114 (H) 12/19/2016   ALT 15 12/19/2016   AST 18 12/19/2016   NA 139 12/19/2016   K 3.9 12/19/2016    CL 102 12/19/2016   CREATININE 0.92 12/19/2016   BUN 10 12/19/2016   CO2 30 12/19/2016   TSH 1.61 12/19/2016    Mm Screening Breast Tomo Bilateral  Result Date: 02/23/2016 CLINICAL DATA:  Screening. EXAM: 2D DIGITAL SCREENING BILATERAL MAMMOGRAM WITH CAD AND ADJUNCT TOMO COMPARISON:  Previous exam(s). ACR Breast Density Category c: The breast tissue is heterogeneously dense, which may obscure small masses. FINDINGS: There are no findings suspicious for malignancy. Images were processed with CAD. IMPRESSION: No mammographic evidence of malignancy. A result letter of this screening mammogram will be mailed directly to the patient. RECOMMENDATION: Screening mammogram in one year. (Code:SM-B-01Y) BI-RADS CATEGORY  1: Negative. Electronically Signed   By: Claudie Revering M.D.   On: 02/29/2016 11:56    Assessment & Plan:  Plan  I am having Ann Maddox maintain her multivitamin, Magnesium, Probiotic Product (PROBIOTIC DAILY PO), Omega-3 Fatty Acids (OMEGA 3 PO), Multiple Vitamins-Minerals (MAXIMUM DAILY GREEN PO), UNABLE TO FIND, NON FORMULARY, vitamin C, and cetirizine.  No orders of the defined types were placed in this encounter.   Problem List Items Addressed This Visit    None    Visit Diagnoses    Neck swelling    -  Primary   Relevant Orders   Thyroid Panel With TSH   CBC with Differential/Platelet   US THYROID      Follow-up: Return if symptoms worsen or fail to improve.  Ann Held, DO

## 2019-05-25 LAB — CBC WITH DIFFERENTIAL/PLATELET
Absolute Monocytes: 429 cells/uL (ref 200–950)
Basophils Absolute: 32 cells/uL (ref 0–200)
Basophils Relative: 0.5 %
Eosinophils Absolute: 51 cells/uL (ref 15–500)
Eosinophils Relative: 0.8 %
HCT: 40.1 % (ref 35.0–45.0)
Hemoglobin: 13.8 g/dL (ref 11.7–15.5)
Lymphs Abs: 2746 cells/uL (ref 850–3900)
MCH: 30.7 pg (ref 27.0–33.0)
MCHC: 34.4 g/dL (ref 32.0–36.0)
MCV: 89.3 fL (ref 80.0–100.0)
MPV: 10.9 fL (ref 7.5–12.5)
Monocytes Relative: 6.7 %
Neutro Abs: 3142 cells/uL (ref 1500–7800)
Neutrophils Relative %: 49.1 %
Platelets: 257 10*3/uL (ref 140–400)
RBC: 4.49 10*6/uL (ref 3.80–5.10)
RDW: 12.4 % (ref 11.0–15.0)
Total Lymphocyte: 42.9 %
WBC: 6.4 10*3/uL (ref 3.8–10.8)

## 2019-05-25 LAB — THYROID PANEL WITH TSH
Free Thyroxine Index: 2.6 (ref 1.4–3.8)
T3 Uptake: 35 % (ref 22–35)
T4, Total: 7.4 ug/dL (ref 5.1–11.9)
TSH: 1.51 mIU/L (ref 0.40–4.50)

## 2019-05-29 ENCOUNTER — Ambulatory Visit (HOSPITAL_COMMUNITY): Payer: BC Managed Care – PPO | Attending: Family Medicine

## 2019-07-05 ENCOUNTER — Ambulatory Visit (INDEPENDENT_AMBULATORY_CARE_PROVIDER_SITE_OTHER): Payer: BC Managed Care – PPO | Admitting: Family Medicine

## 2019-07-05 ENCOUNTER — Encounter: Payer: Self-pay | Admitting: Family Medicine

## 2019-07-05 ENCOUNTER — Other Ambulatory Visit: Payer: Self-pay

## 2019-07-05 ENCOUNTER — Other Ambulatory Visit (HOSPITAL_COMMUNITY)
Admission: RE | Admit: 2019-07-05 | Discharge: 2019-07-05 | Disposition: A | Discharge status: Home / Self Care | Payer: BC Managed Care – PPO | Source: Ambulatory Visit | Attending: Family Medicine | Admitting: Family Medicine

## 2019-07-05 VITALS — BP 120/60 | HR 63 | Temp 97.2°F | Resp 18 | Ht 72.0 in | Wt 184.8 lb

## 2019-07-05 DIAGNOSIS — Z0000 Encounter for general adult medical examination without abnormal findings: Secondary | ICD-10-CM | POA: Insufficient documentation

## 2019-07-05 LAB — COMPREHENSIVE METABOLIC PANEL
ALT: 25 U/L (ref 0–35)
AST: 21 U/L (ref 0–37)
Albumin: 4.7 g/dL (ref 3.5–5.2)
Alkaline Phosphatase: 44 U/L (ref 39–117)
BUN: 15 mg/dL (ref 6–23)
CO2: 31 mEq/L (ref 19–32)
Calcium: 9.2 mg/dL (ref 8.4–10.5)
Chloride: 98 mEq/L (ref 96–112)
Creatinine, Ser: 0.9 mg/dL (ref 0.40–1.20)
GFR: 64.27 mL/min (ref >60.00)
Glucose, Bld: 96 mg/dL (ref 70–99)
Potassium: 4 mEq/L (ref 3.5–5.1)
Sodium: 138 mEq/L (ref 135–145)
Total Bilirubin: 0.6 mg/dL (ref 0.2–1.2)
Total Protein: 6.9 g/dL (ref 6.0–8.3)

## 2019-07-05 LAB — LIPID PANEL
Cholesterol: 230 mg/dL — ABNORMAL HIGH (ref 0–200)
HDL: 60.5 mg/dL (ref >39.00)
LDL Cholesterol: 151 mg/dL — ABNORMAL HIGH (ref 0–99)
NonHDL: 169.9
Total CHOL/HDL Ratio: 4
Triglycerides: 96 mg/dL (ref 0.0–149.0)
VLDL: 19.2 mg/dL (ref 0.0–40.0)

## 2019-07-05 LAB — CBC WITH DIFFERENTIAL/PLATELET
Basophils Absolute: 0 10*3/uL (ref 0.0–0.1)
Basophils Relative: 0.6 % (ref 0.0–3.0)
Eosinophils Absolute: 0 10*3/uL (ref 0.0–0.7)
Eosinophils Relative: 0.9 % (ref 0.0–5.0)
HCT: 41.4 % (ref 36.0–46.0)
Hemoglobin: 13.9 g/dL (ref 12.0–15.0)
Lymphocytes Relative: 44.4 % (ref 12.0–46.0)
Lymphs Abs: 2.3 10*3/uL (ref 0.7–4.0)
MCHC: 33.5 g/dL (ref 30.0–36.0)
MCV: 89.8 fl (ref 78.0–100.0)
Monocytes Absolute: 0.4 10*3/uL (ref 0.1–1.0)
Monocytes Relative: 7.5 % (ref 3.0–12.0)
Neutro Abs: 2.5 10*3/uL (ref 1.4–7.7)
Neutrophils Relative %: 46.6 % (ref 43.0–77.0)
Platelets: 242 10*3/uL (ref 150.0–400.0)
RBC: 4.61 Mil/uL (ref 3.87–5.11)
RDW: 13.1 % (ref 11.5–15.5)
WBC: 5.3 10*3/uL (ref 4.0–10.5)

## 2019-07-05 LAB — TSH: TSH: 1.56 u[IU]/mL (ref 0.35–4.50)

## 2019-07-05 NOTE — Progress Notes (Signed)
Subjective:     Ann Maddox is a 58 y.o. female and is here for a comprehensive physical exam. The patient reports no problems.  Social History   Socioeconomic History  . Marital status: Single    Spouse name: Not on file  . Number of children: Not on file  . Years of education: Not on file  . Highest education level: Not on file  Occupational History  . Not on file  Social Needs  . Financial resource strain: Not on file  . Food insecurity    Worry: Not on file    Inability: Not on file  . Transportation needs    Medical: Not on file    Non-medical: Not on file  Tobacco Use  . Smoking status: Never Smoker  . Smokeless tobacco: Never Used  Substance and Sexual Activity  . Alcohol use: Yes    Alcohol/week: 3.0 standard drinks    Types: 3 Glasses of wine per week    Comment: Red wine  . Drug use: No  . Sexual activity: Yes    Partners: Male  Lifestyle  . Physical activity    Days per week: Not on file    Minutes per session: Not on file  . Stress: Not on file  Relationships  . Social Herbalist on phone: Not on file    Gets together: Not on file    Attends religious service: Not on file    Active member of club or organization: Not on file    Attends meetings of clubs or organizations: Not on file    Relationship status: Not on file  . Intimate partner violence    Fear of current or ex partner: Not on file    Emotionally abused: Not on file    Physically abused: Not on file    Forced sexual activity: Not on file  Other Topics Concern  . Not on file  Social History Narrative  . Not on file   Health Maintenance  Topic Date Due  . MAMMOGRAM  04/03/2020  . PAP SMEAR-Modifier  07/04/2022  . TETANUS/TDAP  08/14/2022  . COLONOSCOPY  04/06/2028  . INFLUENZA VACCINE  Completed  . Hepatitis C Screening  Completed  . HIV Screening  Completed    The following portions of the patient's history were reviewed and updated as appropriate:  She  has a past  medical history of Allergy, Arthritis, and Constipation. She does not have any pertinent problems on file. She  has a past surgical history that includes Other surgical history; Total hip arthroplasty; Bilateral anterior total hip arthroplasty (12/11  06/13); Joint replacement; Colonoscopy; and Polypectomy. Her family history includes Cancer in her father; Diabetes in her father; Glaucoma in her mother; Heart disease in her father, paternal grandfather, and paternal uncle; Hyperlipidemia in her father and mother; Hypertension in her father and mother. She  reports that she has never smoked. She has never used smokeless tobacco. She reports current alcohol use of about 3.0 standard drinks of alcohol per week. She reports that she does not use drugs. She has a current medication list which includes the following prescription(s): vitamin c, cetirizine, magnesium, multivitamin, multiple vitamins-minerals, NON FORMULARY, omega-3 fatty acids, probiotic product, and UNABLE TO FIND. Current Outpatient Medications on File Prior to Visit  Medication Sig Dispense Refill  . Ascorbic Acid (VITAMIN C) 1000 MG tablet Take 1,000 mg by mouth daily.    . cetirizine (ZYRTEC) 10 MG tablet Take 1 tablet (10  mg total) by mouth daily. 90 tablet 1  . Magnesium 400 MG CAPS Take 2 capsules by mouth daily. Reported on 12/17/2015    . Multiple Vitamin (MULTIVITAMIN) tablet Take 1 tablet by mouth daily.    . Multiple Vitamins-Minerals (MAXIMUM DAILY GREEN PO) Take by mouth daily.    . NON FORMULARY Branch Chain Amino Acids    . Omega-3 Fatty Acids (OMEGA 3 PO) Take 2 capsules by mouth daily.    . Probiotic Product (PROBIOTIC DAILY PO) Take 3 capsules by mouth daily.    Marland Kitchen UNABLE TO FIND Med Name: Knute Neu Supplement and Adrenal Supplement     No current facility-administered medications on file prior to visit.    She is allergic to butorphanol tartrate; gluten meal; ketorolac; lactose; and other..  Review of  Systems Review of Systems  Constitutional: Negative for activity change, appetite change and fatigue.  HENT: Negative for hearing loss, congestion, tinnitus and ear discharge.  dentist q13m Eyes: Negative for visual disturbance (see optho q1y -- vision corrected to 20/20 with glasses).  Respiratory: Negative for cough, chest tightness and shortness of breath.   Cardiovascular: Negative for chest pain, palpitations and leg swelling.  Gastrointestinal: Negative for abdominal pain, diarrhea, constipation and abdominal distention.  Genitourinary: Negative for urgency, frequency, decreased urine volume and difficulty urinating.  Musculoskeletal: Negative for back pain, arthralgias and gait problem.  Skin: Negative for color change, pallor and rash.  Neurological: Negative for dizziness, light-headedness, numbness and headaches.  Hematological: Negative for adenopathy. Does not bruise/bleed easily.  Psychiatric/Behavioral: Negative for suicidal ideas, confusion, sleep disturbance, self-injury, dysphoric mood, decreased concentration and agitation.      Objective:    BP 120/60 (BP Location: Right Arm, Patient Position: Sitting, Cuff Size: Normal)   Pulse 63   Temp (!) 97.2 F (36.2 C) (Temporal)   Resp 18   Ht 6' (1.829 m)   Wt 184 lb 12.8 oz (83.8 kg)   LMP 06/04/2012   SpO2 98%   BMI 25.06 kg/m  General appearance: alert, cooperative, appears stated age and no distress Head: Normocephalic, without obvious abnormality, atraumatic Eyes: negative findings: lids and lashes normal, conjunctivae and sclerae normal and pupils equal, round, reactive to light and accomodation Ears: normal TM's and external ear canals both ears Nose: Nares normal. Septum midline. Mucosa normal. No drainage or sinus tenderness. Throat: lips, mucosa, and tongue normal; teeth and gums normal Neck: no adenopathy, no carotid bruit, no JVD, supple, symmetrical, trachea midline and thyroid not enlarged, symmetric, no  tenderness/mass/nodules Back: symmetric, no curvature. ROM normal. No CVA tenderness. Lungs: clear to auscultation bilaterally Breasts: normal appearance, no masses or tenderness Heart: regular rate and rhythm, S1, S2 normal, no murmur, click, rub or gallop Abdomen: soft, non-tender; bowel sounds normal; no masses,  no organomegaly Pelvic: cervix normal in appearance, external genitalia normal, no adnexal masses or tenderness, no cervical motion tenderness, rectovaginal septum normal, uterus normal size, shape, and consistency, vagina normal without discharge and pap done  rectal --heme neg brown stool Extremities: extremities normal, atraumatic, no cyanosis or edema Pulses: 2+ and symmetric Skin: Skin color, texture, turgor normal. No rashes or lesions Lymph nodes: Cervical, supraclavicular, and axillary nodes normal. Neurologic: Alert and oriented X 3, normal strength and tone. Normal symmetric reflexes. Normal coordination and gait    Assessment:    Healthy female exam.      Plan:     ghm utd Check labs  See After Visit Summary for Counseling Recommendations  1. Preventative health care See above  - Lipid panel - CBC with Differential - TSH - Comprehensive metabolic panel - Cytology - PAP( )

## 2019-07-05 NOTE — Patient Instructions (Signed)

## 2019-07-11 LAB — CYTOLOGY - PAP
Chlamydia: NEGATIVE
Comment: NEGATIVE
Comment: NEGATIVE
Comment: NEGATIVE
Comment: NORMAL
Diagnosis: NEGATIVE
HSV1: NEGATIVE
HSV2: NEGATIVE
Neisseria Gonorrhea: NEGATIVE
Trichomonas: NEGATIVE

## 2019-07-17 DIAGNOSIS — Z20828 Contact with and (suspected) exposure to other viral communicable diseases: Secondary | ICD-10-CM | POA: Diagnosis not present

## 2019-09-11 DIAGNOSIS — Z20828 Contact with and (suspected) exposure to other viral communicable diseases: Secondary | ICD-10-CM | POA: Diagnosis not present

## 2019-09-11 DIAGNOSIS — Z03818 Encounter for observation for suspected exposure to other biological agents ruled out: Secondary | ICD-10-CM | POA: Diagnosis not present

## 2019-09-19 DIAGNOSIS — R030 Elevated blood-pressure reading, without diagnosis of hypertension: Secondary | ICD-10-CM | POA: Diagnosis not present

## 2019-09-19 DIAGNOSIS — Z03818 Encounter for observation for suspected exposure to other biological agents ruled out: Secondary | ICD-10-CM | POA: Diagnosis not present

## 2019-09-19 DIAGNOSIS — Z20828 Contact with and (suspected) exposure to other viral communicable diseases: Secondary | ICD-10-CM | POA: Diagnosis not present

## 2020-01-28 DIAGNOSIS — R7301 Impaired fasting glucose: Secondary | ICD-10-CM | POA: Diagnosis not present

## 2020-01-28 DIAGNOSIS — R5383 Other fatigue: Secondary | ICD-10-CM | POA: Diagnosis not present

## 2020-01-28 DIAGNOSIS — E559 Vitamin D deficiency, unspecified: Secondary | ICD-10-CM | POA: Diagnosis not present

## 2020-01-28 DIAGNOSIS — E782 Mixed hyperlipidemia: Secondary | ICD-10-CM | POA: Diagnosis not present

## 2020-01-28 DIAGNOSIS — R635 Abnormal weight gain: Secondary | ICD-10-CM | POA: Diagnosis not present

## 2020-01-28 DIAGNOSIS — N951 Menopausal and female climacteric states: Secondary | ICD-10-CM | POA: Diagnosis not present

## 2020-02-05 DIAGNOSIS — Z1339 Encounter for screening examination for other mental health and behavioral disorders: Secondary | ICD-10-CM | POA: Diagnosis not present

## 2020-02-05 DIAGNOSIS — E782 Mixed hyperlipidemia: Secondary | ICD-10-CM | POA: Diagnosis not present

## 2020-02-05 DIAGNOSIS — R232 Flushing: Secondary | ICD-10-CM | POA: Diagnosis not present

## 2020-02-05 DIAGNOSIS — R7301 Impaired fasting glucose: Secondary | ICD-10-CM | POA: Diagnosis not present

## 2020-02-05 DIAGNOSIS — N951 Menopausal and female climacteric states: Secondary | ICD-10-CM | POA: Diagnosis not present

## 2020-02-05 DIAGNOSIS — Z1331 Encounter for screening for depression: Secondary | ICD-10-CM | POA: Diagnosis not present

## 2020-02-18 DIAGNOSIS — N951 Menopausal and female climacteric states: Secondary | ICD-10-CM | POA: Diagnosis not present

## 2020-02-18 DIAGNOSIS — E559 Vitamin D deficiency, unspecified: Secondary | ICD-10-CM | POA: Diagnosis not present

## 2020-02-18 DIAGNOSIS — Z6824 Body mass index (BMI) 24.0-24.9, adult: Secondary | ICD-10-CM | POA: Diagnosis not present

## 2020-02-18 DIAGNOSIS — E782 Mixed hyperlipidemia: Secondary | ICD-10-CM | POA: Diagnosis not present

## 2020-02-25 DIAGNOSIS — Z6824 Body mass index (BMI) 24.0-24.9, adult: Secondary | ICD-10-CM | POA: Diagnosis not present

## 2020-02-25 DIAGNOSIS — E782 Mixed hyperlipidemia: Secondary | ICD-10-CM | POA: Diagnosis not present

## 2020-03-03 DIAGNOSIS — R7301 Impaired fasting glucose: Secondary | ICD-10-CM | POA: Diagnosis not present

## 2020-03-03 DIAGNOSIS — Z6824 Body mass index (BMI) 24.0-24.9, adult: Secondary | ICD-10-CM | POA: Diagnosis not present

## 2020-03-03 DIAGNOSIS — E782 Mixed hyperlipidemia: Secondary | ICD-10-CM | POA: Diagnosis not present

## 2020-03-12 DIAGNOSIS — E782 Mixed hyperlipidemia: Secondary | ICD-10-CM | POA: Diagnosis not present

## 2020-03-12 DIAGNOSIS — R7301 Impaired fasting glucose: Secondary | ICD-10-CM | POA: Diagnosis not present

## 2020-03-12 DIAGNOSIS — Z6824 Body mass index (BMI) 24.0-24.9, adult: Secondary | ICD-10-CM | POA: Diagnosis not present

## 2020-03-17 DIAGNOSIS — R5383 Other fatigue: Secondary | ICD-10-CM | POA: Diagnosis not present

## 2020-03-17 DIAGNOSIS — R7301 Impaired fasting glucose: Secondary | ICD-10-CM | POA: Diagnosis not present

## 2020-03-17 DIAGNOSIS — N951 Menopausal and female climacteric states: Secondary | ICD-10-CM | POA: Diagnosis not present

## 2020-03-17 DIAGNOSIS — Z6824 Body mass index (BMI) 24.0-24.9, adult: Secondary | ICD-10-CM | POA: Diagnosis not present

## 2020-03-17 DIAGNOSIS — D509 Iron deficiency anemia, unspecified: Secondary | ICD-10-CM | POA: Diagnosis not present

## 2020-03-25 DIAGNOSIS — N951 Menopausal and female climacteric states: Secondary | ICD-10-CM | POA: Diagnosis not present

## 2020-03-25 DIAGNOSIS — E782 Mixed hyperlipidemia: Secondary | ICD-10-CM | POA: Diagnosis not present

## 2020-04-08 DIAGNOSIS — R7301 Impaired fasting glucose: Secondary | ICD-10-CM | POA: Diagnosis not present

## 2020-04-08 DIAGNOSIS — Z6823 Body mass index (BMI) 23.0-23.9, adult: Secondary | ICD-10-CM | POA: Diagnosis not present

## 2020-04-28 DIAGNOSIS — E559 Vitamin D deficiency, unspecified: Secondary | ICD-10-CM | POA: Diagnosis not present

## 2020-04-28 DIAGNOSIS — Z6823 Body mass index (BMI) 23.0-23.9, adult: Secondary | ICD-10-CM | POA: Diagnosis not present

## 2020-06-03 DIAGNOSIS — R109 Unspecified abdominal pain: Secondary | ICD-10-CM | POA: Diagnosis not present

## 2020-06-11 DIAGNOSIS — Z0189 Encounter for other specified special examinations: Secondary | ICD-10-CM | POA: Diagnosis not present

## 2020-06-11 DIAGNOSIS — Z20822 Contact with and (suspected) exposure to covid-19: Secondary | ICD-10-CM | POA: Diagnosis not present

## 2020-07-06 DIAGNOSIS — Z0001 Encounter for general adult medical examination with abnormal findings: Secondary | ICD-10-CM | POA: Diagnosis not present

## 2020-07-06 DIAGNOSIS — E559 Vitamin D deficiency, unspecified: Secondary | ICD-10-CM | POA: Diagnosis not present

## 2020-07-07 ENCOUNTER — Encounter: Payer: BC Managed Care – PPO | Admitting: Family Medicine

## 2020-07-29 DIAGNOSIS — D225 Melanocytic nevi of trunk: Secondary | ICD-10-CM | POA: Diagnosis not present

## 2020-07-29 DIAGNOSIS — D485 Neoplasm of uncertain behavior of skin: Secondary | ICD-10-CM | POA: Diagnosis not present

## 2020-07-29 DIAGNOSIS — L821 Other seborrheic keratosis: Secondary | ICD-10-CM | POA: Diagnosis not present

## 2020-07-29 DIAGNOSIS — L814 Other melanin hyperpigmentation: Secondary | ICD-10-CM | POA: Diagnosis not present

## 2020-07-29 DIAGNOSIS — L820 Inflamed seborrheic keratosis: Secondary | ICD-10-CM | POA: Diagnosis not present

## 2020-07-31 DIAGNOSIS — Z1231 Encounter for screening mammogram for malignant neoplasm of breast: Secondary | ICD-10-CM | POA: Diagnosis not present

## 2023-06-20 NOTE — Progress Notes (Unsigned)
62 y.o. G1P0 Single Caucasian female here for NEW GYN//annual exam.    On HRT for about 1 month and uses vaginal estradiol cream.  Ran out of her estradiol patch about 3 weeks ago.  Had been in HRT up until 2015/2016 and then recently restarted.  Wondering if her hormones are affecting her well being.  Feels increased heat at night but no real night sweats.  No hot flashes during the day.   Professional stress is a primary concern.  Would like a therapist.  Decreased sex drive.  Not sexually active.   Eating well, doing exercise.   Would like to have potential bone density.   PCP: Ann Davenport, MD   Patient's last menstrual period was 06/04/2012.           Sexually active: Yes.    The current method of family planning is post menopausal status.    Menopausal hormone therapy:  estrace cream, vivelle dot, progesterone Exercising: Yes.     Yoga, pilates, strength training Smoker:  no  OB History  Gravida Para Term Preterm AB Living  1         1  SAB IAB Ectopic Multiple Live Births               # Outcome Date GA Lbr Len/2nd Weight Sex Type Anes PTL Lv  1 Gravida              HEALTH MAINTENANCE: Last 2 paps: 04/2023 per pt--Non-diagnostic  07/05/19 neg, 12/15/14 neg History of abnormal Pap or positive HPV:  no Mammogram:   04/2023 per pt Colonoscopy:  04/06/18, 10 year plan Bone Density:  n/a  Result  n/a   Immunization History  Administered Date(s) Administered   Influenza Whole 06/18/2009, 05/01/2012   Influenza,inj,Quad PF,6+ Mos 05/10/2019   Tdap 08/14/2012   Zoster Recombinant(Shingrix) 12/19/2016, 11/07/2017      reports that she has never smoked. She has never used smokeless tobacco. She reports current alcohol use of about 3.0 standard drinks of alcohol per week. She reports that she does not use drugs.  Past Medical History:  Diagnosis Date   Allergy    Arthritis    hips, shoulders   Constipation    Mag helpd- uses juice plus- has a daily BM's  that soft     Past Surgical History:  Procedure Laterality Date   BILATERAL ANTERIOR TOTAL HIP ARTHROPLASTY  12/11  06/13   COLONOSCOPY     JOINT REPLACEMENT     second hip 2013   OTHER SURGICAL HISTORY     upper jaw sx   POLYPECTOMY     TOTAL HIP ARTHROPLASTY     bil.    Current Outpatient Medications  Medication Sig Dispense Refill   Ascorbic Acid (VITAMIN C) 1000 MG tablet Take 1,000 mg by mouth daily.     cetirizine (ZYRTEC) 10 MG tablet Take 1 tablet (10 mg total) by mouth daily. 90 tablet 1   estradiol (ESTRACE) 0.1 MG/GM vaginal cream Place vaginally.     estradiol (VIVELLE-DOT) 0.025 MG/24HR 1 patch 2 (two) times a week.     Magnesium 400 MG CAPS Take 2 capsules by mouth daily. Reported on 12/17/2015     Multiple Vitamin (MULTIVITAMIN) tablet Take 1 tablet by mouth daily.     Multiple Vitamins-Minerals (MAXIMUM DAILY GREEN PO) Take by mouth daily.     Omega-3 Fatty Acids (OMEGA 3 PO) Take 2 capsules by mouth daily.     progesterone (  PROMETRIUM) 100 MG capsule Take 100 mg by mouth at bedtime.     UNABLE TO FIND Med Name: Donato Heinz Supplement and Adrenal Supplement     No current facility-administered medications for this visit.    ALLERGIES: Butorphanol tartrate, Gluten meal, Lactose, and Other  Family History  Problem Relation Age of Onset   Hypertension Mother    Hyperlipidemia Mother    Glaucoma Mother    Breast cancer Mother    Hypertension Father    Hyperlipidemia Father    Diabetes Father        secondary to pancreatic ca   Cancer Father        pancreatic can   Heart disease Father        stent   Heart disease Paternal Grandfather        MI   Heart disease Paternal Uncle    Colon cancer Neg Hx    Colon polyps Neg Hx    Esophageal cancer Neg Hx    Rectal cancer Neg Hx    Stomach cancer Neg Hx     Review of Systems  All other systems reviewed and are negative.   PHYSICAL EXAM:  BP 122/70 (BP Location: Right Arm, Patient Position: Sitting,  Cuff Size: Normal)   Ht 6' (1.829 m)   Wt 177 lb (80.3 kg)   LMP 06/04/2012   BMI 24.01 kg/m     General appearance: alert, cooperative and appears stated age Head: normocephalic, without obvious abnormality, atraumatic Neck: no adenopathy, supple, symmetrical, trachea midline and thyroid normal to inspection and palpation Lungs: clear to auscultation bilaterally Breasts: normal appearance, no masses or tenderness, No nipple retraction or dimpling, No nipple discharge or bleeding, No axillary adenopathy Heart: regular rate and rhythm Abdomen: soft, non-tender; no masses, no organomegaly Extremities: extremities normal, atraumatic, no cyanosis or edema Skin: skin color, texture, turgor normal. No rashes or lesions Lymph nodes: cervical, supraclavicular, and axillary nodes normal. Neurologic: grossly normal  Pelvic: External genitalia:  no lesions              No abnormal inguinal nodes palpated.              Urethra:  normal appearing urethra with no masses, tenderness or lesions              Bartholins and Skenes: normal                 Vagina: normal appearing vagina with normal color and discharge, no lesions              Cervix: no lesions              Pap taken: Yes.   Bimanual Exam:  Uterus:  normal size, contour, position, consistency, mobility, non-tender              Adnexa: no mass, fullness, tenderness              Rectal exam: Yes.  .  Confirms.              Anus:  normal sphincter tone, no lesions  Chaperone was present for exam:  Warren Lacy, CMA  ASSESSMENT: Well woman visit with gynecologic exam Menopausal symptoms.  FH breast and pancreatic cancer. Mother and maternal aunt with breast cancer.  Cervical cancer screening.  Stress.   PLAN: Mammogram screening discussed.  Will get copy of mammogram/breast US from [facility].  Self breast awareness reviewed. Pap and HRV collected:  Yes.  Guidelines for Calcium, Vitamin D, regular exercise program including  cardiovascular and weight bearing exercise. Referral for genetic counseling and potential testing.  Discused WHI and use of HRT which can increase risk of PE, DVT, MI, stroke and breast cancer.  Stop HRT after discussing above.  Rx for vaginal estradiol cream.  Rx for Zoloft 50 mg daily.  I discussed risks and benefits.  Brochure for psychologists from [employer] for life coaching.  Follow up:   6 weeks for a recheck and then in 1 year for annual exam.    Additional counseling given.  Yes.  . 30  total time was spent for this patient encounter, including preparation, face-to-face counseling with the patient, coordination of care, and documentation of the encounter in addition to doing the well woman visit with gynecologic exam.

## 2023-07-04 ENCOUNTER — Other Ambulatory Visit (HOSPITAL_COMMUNITY)
Admission: RE | Admit: 2023-07-04 | Discharge: 2023-07-04 | Disposition: A | Discharge status: Home / Self Care | Payer: Managed Care, Other (non HMO) | Source: Ambulatory Visit | Attending: Obstetrics and Gynecology | Admitting: Obstetrics and Gynecology

## 2023-07-04 ENCOUNTER — Ambulatory Visit (INDEPENDENT_AMBULATORY_CARE_PROVIDER_SITE_OTHER): Payer: Managed Care, Other (non HMO) | Admitting: Obstetrics and Gynecology

## 2023-07-04 ENCOUNTER — Encounter: Payer: Self-pay | Admitting: Obstetrics and Gynecology

## 2023-07-04 VITALS — BP 122/70 | Ht 72.0 in | Wt 177.0 lb

## 2023-07-04 DIAGNOSIS — F439 Reaction to severe stress, unspecified: Secondary | ICD-10-CM | POA: Diagnosis not present

## 2023-07-04 DIAGNOSIS — Z8042 Family history of malignant neoplasm of prostate: Secondary | ICD-10-CM | POA: Diagnosis not present

## 2023-07-04 DIAGNOSIS — Z780 Asymptomatic menopausal state: Secondary | ICD-10-CM

## 2023-07-04 DIAGNOSIS — Z01419 Encounter for gynecological examination (general) (routine) without abnormal findings: Secondary | ICD-10-CM | POA: Diagnosis not present

## 2023-07-04 DIAGNOSIS — N951 Menopausal and female climacteric states: Secondary | ICD-10-CM | POA: Diagnosis not present

## 2023-07-04 DIAGNOSIS — Z800 Family history of malignant neoplasm of digestive organs: Secondary | ICD-10-CM

## 2023-07-04 DIAGNOSIS — Z124 Encounter for screening for malignant neoplasm of cervix: Secondary | ICD-10-CM | POA: Insufficient documentation

## 2023-07-04 DIAGNOSIS — Z803 Family history of malignant neoplasm of breast: Secondary | ICD-10-CM

## 2023-07-04 MED ORDER — SERTRALINE HCL 50 MG PO TABS: 50.0000 mg | ORAL_TABLET | Freq: Every day | ORAL | 1 refills | Status: DC

## 2023-07-04 NOTE — Patient Instructions (Signed)

## 2023-07-05 ENCOUNTER — Encounter: Payer: Self-pay | Admitting: Obstetrics and Gynecology

## 2023-07-05 MED ORDER — ESTRADIOL 0.1 MG/GM VA CREA: TOPICAL_CREAM | VAGINAL | 1 refills | Status: DC

## 2023-07-07 LAB — CYTOLOGY - PAP
Comment: NEGATIVE
Diagnosis: NEGATIVE
High risk HPV: NEGATIVE

## 2023-08-02 DIAGNOSIS — M8580 Other specified disorders of bone density and structure, unspecified site: Secondary | ICD-10-CM

## 2023-08-02 HISTORY — DX: Other specified disorders of bone density and structure, unspecified site: M85.80

## 2023-08-03 NOTE — Progress Notes (Deleted)
 GYNECOLOGY  VISIT   HPI: 63 y.o.   Single  Caucasian female   G1P0 with Patient's last menstrual period was 06/04/2012.   here for: 6 week f/u     GYNECOLOGIC HISTORY: Patient's last menstrual period was 06/04/2012. Contraception:  PMP Menopausal hormone therapy:  estrace  Last 2 paps:  07/04/23 neg: HR HPV neg, 07/05/19 neg History of abnormal Pap or positive HPV:  no Mammogram:  12/02/22 Breast Density Cat B, BI-RADS CAT 1 neg        OB History     Gravida  1   Para      Term      Preterm      AB      Living  1      SAB      IAB      Ectopic      Multiple      Live Births                 Patient Active Problem List   Diagnosis Date Noted   Sinusitis 09/05/2011   Lumbar paraspinal muscle spasm 09/05/2011   SHOULDER PAIN, LEFT 06/18/2009   TOE PAIN 06/18/2009   EXCESSIVE OR FREQUENT MENSTRUATION 03/20/2009   Backache 11/17/2008   FIBROIDS, UTERUS 04/18/2008   ABDOMINAL BLOATING 04/11/2008   Allergic rhinitis 10/31/2006    Past Medical History:  Diagnosis Date   Allergy    Arthritis    hips, shoulders   Constipation    Mag helpd- uses juice plus- has a daily BM's that soft     Past Surgical History:  Procedure Laterality Date   BILATERAL ANTERIOR TOTAL HIP ARTHROPLASTY  12/11  06/13   COLONOSCOPY     JOINT REPLACEMENT     second hip 2013   OTHER SURGICAL HISTORY     upper jaw sx   POLYPECTOMY     TOTAL HIP ARTHROPLASTY     bil.    Current Outpatient Medications  Medication Sig Dispense Refill   Ascorbic Acid (VITAMIN C) 1000 MG tablet Take 1,000 mg by mouth daily.     cetirizine  (ZYRTEC ) 10 MG tablet Take 1 tablet (10 mg total) by mouth daily. 90 tablet 1   estradiol  (ESTRACE ) 0.1 MG/GM vaginal cream Place 1/2 gram per vagina at hs 2 - 3 nights per week. 42.5 g 1   Magnesium 400 MG CAPS Take 2 capsules by mouth daily. Reported on 12/17/2015     Multiple Vitamin (MULTIVITAMIN) tablet Take 1 tablet by mouth daily.     Multiple  Vitamins-Minerals (MAXIMUM DAILY GREEN PO) Take by mouth daily.     Omega-3 Fatty Acids (OMEGA 3 PO) Take 2 capsules by mouth daily.     sertraline  (ZOLOFT ) 50 MG tablet Take 1 tablet (50 mg total) by mouth daily. 30 tablet 1   UNABLE TO FIND Med Name: Gaia Thryoid Supplement and Adrenal Supplement     No current facility-administered medications for this visit.     ALLERGIES: Butorphanol tartrate, Gluten meal, Lactose, and Other  Family History  Problem Relation Age of Onset   Hypertension Mother    Hyperlipidemia Mother    Glaucoma Mother    Breast cancer Mother    Hypertension Father    Hyperlipidemia Father    Diabetes Father        secondary to pancreatic ca   Cancer Father        pancreatic can   Heart disease Father  stent   Heart disease Paternal Grandfather        MI   Heart disease Paternal Uncle    Colon cancer Neg Hx    Colon polyps Neg Hx    Esophageal cancer Neg Hx    Rectal cancer Neg Hx    Stomach cancer Neg Hx     Social History   Socioeconomic History   Marital status: Single    Spouse name: Not on file   Number of children: Not on file   Years of education: Not on file   Highest education level: Not on file  Occupational History    Comment: [employer]   Tobacco Use   Smoking status: Never   Smokeless tobacco: Never  Vaping Use   Vaping status: Never Used  Substance and Sexual Activity   Alcohol use: Yes    Alcohol/week: 3.0 standard drinks of alcohol    Types: 3 Glasses of wine per week    Comment: Red wine   Drug use: No   Sexual activity: Yes    Partners: Male    Birth control/protection: Post-menopausal  Other Topics Concern   Not on file  Social History Narrative   Not on file   Social Drivers of Health   Financial Resource Strain: Not on file  Food Insecurity: Not on file  Transportation Needs: Not on file  Physical Activity: Not on file  Stress: Not on file  Social Connections: Not on file  Intimate Partner  Violence: Not on file    Review of Systems  PHYSICAL EXAMINATION:   LMP 06/04/2012     General appearance: alert, cooperative and appears stated age Head: Normocephalic, without obvious abnormality, atraumatic Neck: no adenopathy, supple, symmetrical, trachea midline and thyroid  normal to inspection and palpation Lungs: clear to auscultation bilaterally Breasts: normal appearance, no masses or tenderness, No nipple retraction or dimpling, No nipple discharge or bleeding, No axillary or supraclavicular adenopathy Heart: regular rate and rhythm Abdomen: soft, non-tender, no masses,  no organomegaly Extremities: extremities normal, atraumatic, no cyanosis or edema Skin: Skin color, texture, turgor normal. No rashes or lesions Lymph nodes: Cervical, supraclavicular, and axillary nodes normal. No abnormal inguinal nodes palpated Neurologic: Grossly normal  Pelvic: External genitalia:  no lesions              Urethra:  normal appearing urethra with no masses, tenderness or lesions              Bartholins and Skenes: normal                 Vagina: normal appearing vagina with normal color and discharge, no lesions              Cervix: no lesions                Bimanual Exam:  Uterus:  normal size, contour, position, consistency, mobility, non-tender              Adnexa: no mass, fullness, tenderness              Rectal exam: {yes no:314532}.  Confirms.              Anus:  normal sphincter tone, no lesions  Chaperone was present for exam:  {BSCHAPERONE:31226::"Jax Abdelrahman F, CMA"}  ASSESSMENT:    PLAN:    {LABS (Optional):23779}  ***  total time was spent for this patient encounter, including preparation, face-to-face counseling with the patient, coordination of care, and documentation of the  encounter.

## 2023-08-16 ENCOUNTER — Ambulatory Visit: Payer: Managed Care, Other (non HMO) | Admitting: Obstetrics and Gynecology

## 2023-08-28 ENCOUNTER — Encounter: Payer: Self-pay | Admitting: Genetic Counselor

## 2023-08-29 ENCOUNTER — Encounter: Payer: Self-pay | Admitting: Genetic Counselor

## 2023-08-29 ENCOUNTER — Other Ambulatory Visit: Payer: Self-pay | Admitting: Genetic Counselor

## 2023-08-29 ENCOUNTER — Inpatient Hospital Stay: Payer: Managed Care, Other (non HMO)

## 2023-08-29 ENCOUNTER — Other Ambulatory Visit: Payer: Self-pay | Admitting: Medical Genetics

## 2023-08-29 ENCOUNTER — Inpatient Hospital Stay: Payer: Managed Care, Other (non HMO) | Attending: Genetic Counselor | Admitting: Genetic Counselor

## 2023-08-29 DIAGNOSIS — Z800 Family history of malignant neoplasm of digestive organs: Secondary | ICD-10-CM | POA: Insufficient documentation

## 2023-08-29 DIAGNOSIS — Z8042 Family history of malignant neoplasm of prostate: Secondary | ICD-10-CM | POA: Diagnosis not present

## 2023-08-29 DIAGNOSIS — Z803 Family history of malignant neoplasm of breast: Secondary | ICD-10-CM | POA: Insufficient documentation

## 2023-08-29 DIAGNOSIS — Z1379 Encounter for other screening for genetic and chromosomal anomalies: Secondary | ICD-10-CM

## 2023-08-29 LAB — GENETIC SCREENING ORDER

## 2023-08-29 NOTE — Progress Notes (Addendum)
REFERRING PROVIDER: Patton Salles, MD [address] [place],  [place] [address]  PRIMARY PROVIDER:  [name], MD  PRIMARY REASON FOR VISIT:  1. Family history of breast cancer   2. Family history of prostate cancer   3. Family history of pancreatic cancer      HISTORY OF PRESENT ILLNESS:   Ann Maddox, a 63 y.o. female, was seen for a [facility] cancer genetics consultation at the request of Dr. Ardell Isaacs due to a family history of cancer.  Ann Maddox presents to clinic today to discuss the possibility of a hereditary predisposition to cancer, genetic testing, and to further clarify her future cancer risks, as well as potential cancer risks for family members.   Ann Maddox is a 63 y.o. female with no personal history of cancer.    CANCER HISTORY:  Oncology History   No history exists.     RISK FACTORS:  Menarche was at age 57.  First live birth at age 89.  OCP use for approximately 10 years.  Ovaries intact: yes.  Hysterectomy: no.  Menopausal status: postmenopausal.  HRT use: 0 years. Colonoscopy: yes; normal. Mammogram within the last year: yes. Number of breast biopsies: 0. Up to date with pelvic exams: no. Any excessive radiation exposure in the past: no  Past Medical History:  Diagnosis Date   Allergy    Arthritis    hips, shoulders   Constipation    Mag helpd- uses juice plus- has a daily BM's that soft    Family history of breast cancer    Family history of pancreatic cancer    Family history of prostate cancer     Past Surgical History:  Procedure Laterality Date   BILATERAL ANTERIOR TOTAL HIP ARTHROPLASTY  12/11  06/13   COLONOSCOPY     JOINT REPLACEMENT     second hip 2013   OTHER SURGICAL HISTORY     upper jaw sx   POLYPECTOMY     TOTAL HIP ARTHROPLASTY     bil.    Social History   Socioeconomic History   Marital status: Single    Spouse name: Not on file   Number of children: Not on file    Years of education: Not on file   Highest education level: Not on file  Occupational History    Comment: [employer]   Tobacco Use   Smoking status: Never   Smokeless tobacco: Never  Vaping Use   Vaping status: Never Used  Substance and Sexual Activity   Alcohol use: Yes    Alcohol/week: 3.0 standard drinks of alcohol    Types: 3 Glasses of wine per week    Comment: Red wine   Drug use: No   Sexual activity: Yes    Partners: Male    Birth control/protection: Post-menopausal  Other Topics Concern   Not on file  Social History Narrative   Not on file   Social Drivers of Health   Financial Resource Strain: Not on file  Food Insecurity: Not on file  Transportation Needs: Not on file  Physical Activity: Not on file  Stress: Not on file  Social Connections: Not on file     FAMILY HISTORY:  We obtained a detailed, 4-generation family history.  Significant diagnoses are listed below: Family History  Problem Relation Age of Onset   Hypertension Mother    Hyperlipidemia Mother    Glaucoma Mother    Breast cancer Mother 56  Hypertension Father    Hyperlipidemia Father    Diabetes Father        secondary to pancreatic ca   Pancreatic cancer Father 38       pancreatic can   Heart disease Father        stent   Prostate cancer Father 60   Breast cancer Maternal Aunt 69   Heart disease Paternal Uncle    Lung cancer Paternal Grandmother        smoker   Heart disease Paternal Grandfather        MI   Colon cancer Neg Hx    Colon polyps Neg Hx    Esophageal cancer Neg Hx    Rectal cancer Neg Hx    Stomach cancer Neg Hx      The patient has one daughter who is cancer free.  She has one brother who is cancer free.  Her father is deceased and her mother is living.   The patient's mother was diagnosed with breast cancer at 64.  She has two sisters, one who had breast cancer at 27.  There is no other reported family history of cancer.  The patient's father developed  pancreatic cancer at 62 and prostate cancer at 58.  He had one brother who died at 1 from heart disease.  His mother had lung cancer.  Ann Maddox is unaware of previous family history of genetic testing for hereditary cancer risks. There is no reported Ashkenazi Jewish ancestry. There is no known consanguinity.  GENETIC COUNSELING ASSESSMENT: Ms. Levels is a 63 y.o. female with a family history of cancer which is somewhat suggestive of a hereditary cancer syndrome and predisposition to cancer given her father's pancreatic cancer. We, therefore, discussed and recommended the following at today's visit.   DISCUSSION: We discussed that, in general, most cancer is not inherited in families, but instead is sporadic or familial. Sporadic cancers occur by chance and typically happen at older ages (>50 years) as this type of cancer is caused by genetic changes acquired during an individual's lifetime. Some families have more cancers than would be expected by chance; however, the ages or types of cancer are not consistent with a known genetic mutation or known genetic mutations have been ruled out. This type of familial cancer is thought to be due to a combination of multiple genetic, environmental, hormonal, and lifestyle factors. While this combination of factors likely increases the risk of cancer, the exact source of this risk is not currently identifiable or testable.  We discussed that up to 15% of pancreatic cancer is hereditary, with most cases associated with BRCA mutations.  There are other genes that can be associated with hereditary pancreatic cancer syndromes.  These include Lynch syndrome and a few others.  We discussed that testing is beneficial for several reasons including knowing how to follow individuals and understand if other family members could be at risk for cancer and allow them to undergo genetic testing.   We reviewed the characteristics, features and inheritance patterns of hereditary  cancer syndromes. We also discussed genetic testing, including the appropriate family members to test, the process of testing, insurance coverage and turn-around-time for results. We discussed the implications of a negative, positive, carrier and/or variant of uncertain significant result. Ms. Shoaf  was offered a common hereditary cancer panel (36+ genes) and an expanded pan-cancer panel (70+ genes). Ms. Fuquay was informed of the benefits and limitations of each panel, including that expanded pan-cancer panels contain genes that  do not have clear management guidelines at this point in time.  We also discussed that as the number of genes included on a panel increases, the chances of variants of uncertain significance increases. Ms. Burkemper decided to pursue genetic testing for the CancerNext-Expanded+RNAinsight gene panel.   The CancerNext-Expanded gene panel offered by [facility] and includes sequencing, rearrangement, and RNA analysis for the following 76 genes: AIP, ALK, APC, ATM, AXIN2, BAP1, BARD1, BMPR1A, BRCA1, BRCA2, BRIP1, CDC73, CDH1, CDK4, CDKN1B, CDKN2A, CEBPA, CHEK2, CTNNA1, DDX41, DICER1, ETV6, FH, FLCN, GATA2, LZTR1, MAX, MBD4, MEN1, MET, MLH1, MSH2, MSH3, MSH6, MUTYH, NF1, NF2, NTHL1, PALB2, PHOX2B, PMS2, POT1, PRKAR1A, PTCH1, PTEN, RAD51C, RAD51D, RB1, RET, RUNX1, SDHA, SDHAF2, SDHB, SDHC, SDHD, SMAD4, SMARCA4, SMARCB1, SMARCE1, STK11, SUFU, TMEM127, TP53, TSC1, TSC2, VHL, and WT1 (sequencing and deletion/duplication); EGFR, HOXB13, KIT, MITF, PDGFRA, POLD1, and POLE (sequencing only); EPCAM and GREM1 (deletion/duplication only).    Based on Ms. Mallet's family history of cancer, she meets medical criteria for genetic testing. Though Ms. Nutting is not personally affected, there are no affected family members that are willing/able/available to undergo hereditary cancer testing.  Therefore, Ms. Woodardis the most informative family member available.  Despite that she meets criteria, she  may still have an out of pocket cost. We discussed that if her out of pocket cost for testing is over $100, the laboratory will call and confirm whether she wants to proceed with testing.  If the out of pocket cost of testing is less than $100 she will be billed by the genetic testing laboratory.   We discussed that some people do not want to undergo genetic testing due to fear of genetic discrimination.  The Genetic Information Nondiscrimination Act (GINA) was signed into federal law in 2008. GINA prohibits health insurers and most employers from discriminating against individuals based on genetic information (including the results of genetic tests and family history information). According to GINA, health insurance companies cannot consider genetic information to be a preexisting condition, nor can they use it to make decisions regarding coverage or rates. GINA also makes it illegal for most employers to use genetic information in making decisions about hiring, firing, promotion, or terms of employment. It is important to note that GINA does not offer protections for life insurance, disability insurance, or long-term care insurance. GINA does not apply to those in the [employer], those who work for companies with less than 15 employees, and new life insurance or long-term disability insurance policies.  Health status due to a cancer diagnosis is not protected under GINA. More information about GINA can be found by visiting [email or web address].  The Tyrer-Cuzick model is one of multiple prediction models developed to estimate an individual's lifetime risk of developing breast cancer. The Tyrer-Cuzick model is endorsed by the [employer] (NCCN). This model includes many risk factors such as family history, endogenous estrogen exposure, and benign breast disease. The calculation is highly-dependent on the accuracy of clinical data provided by the patient and can change over time. The  Tyrer-Cuzick model may be repeated to reflect new information in her personal or family history in the future.   Based on the patient's family history, a statistical model Charity fundraiser) was used to estimate her risk of developing breast cancer. This estimates her lifetime risk of developing breast cancer to be approximately 13.9%. This estimation does not consider any genetic testing results.  The patient's lifetime breast cancer risk is a preliminary estimate based on available information using one of  several models endorsed by the American Cancer Society (ACS). The ACS recommends consideration of breast MRI screening as an adjunct to mammography for patients at high risk (defined as 20% or greater lifetime risk). Please note that a woman's breast cancer risk changes over time. It may increase or decrease based on age and any changes to the personal and/or family medical history. The risks and recommendations listed above apply to this patient at this point in time. In the future, she may or may not be eligible for the same medical management strategies and, in some cases, other medical management strategies may become available to her. If she is interested in an updated breast cancer risk assessment at a later date, she can contact us.    Alternatively, Ms. Vath could participate in the [employer].  Nonnie Done is a Fish farm manager offered by [employer].  This program provides sequencing of a patient's exome, while providing genetic test results back for Hereditary Breast and Ovarian Cancer Syndrome (BRCA1 and BRCA2), Lynch syndrome (MLH1, MSH2, EPCAM, MSH6 and PMS2), and Familial Hypercholesterolemia (LDLR, APOB, PCSK9, and LDLRAP1). We discussed that families with a strong history of cancer, may not get enough testing through the [facility] program, but that families without a strong history or no history, may benefit from this program.  Participating with  Nonnie Done is free to the individual and will allow them to obtain genetic testing in the future at a lower cost for other conditions by uncovering genes tested through the exome testing.  This program is available in addition to any genetic testing that may be performed through the [facility] Cancer Program.  Ms. Maulding indicated that she interested in this program and will join online.  PLAN: After considering the risks, benefits, and limitations, Ms. Ellers provided informed consent to pursue genetic testing and the blood sample was sent to [facility] for analysis of the CancerNext-Expanded+RNAinsight. Results should be available within approximately 2-3 weeks' time, at which point they will be disclosed by telephone to Ms. Brouse, as will any additional recommendations warranted by these results. Ms. Peeks will receive a summary of her genetic counseling visit and a copy of her results once available. This information will also be available in Epic.   Lastly, we encouraged Ms. Bertz to remain in contact with cancer genetics annually so that we can continuously update the family history and inform her of any changes in cancer genetics and testing that may be of benefit for this family.   Ms. Knueppel questions were answered to her satisfaction today. Our contact information was provided should additional questions or concerns arise. Thank you for the referral and allowing [place] to share in the care of your patient.   Marc Sivertsen P. Lowell Guitar, MS, CGC Licensed, Patent attorney [email or web address] phone: [phone]  80 minutes were spent on the date of the encounter in service to the patient including preparation, face-to-face consultation, documentation and care coordination.  The patient was seen alone.  Drs. Meliton Rattan, and/or [place] were available for questions, if needed..    _______________________________________________________________________ For  Office Staff:  Number of people involved in session: 1 Was an Intern/ student involved with case: no

## 2023-09-11 NOTE — Progress Notes (Deleted)
 GYNECOLOGY  VISIT   HPI: 63 y.o.   Single  Caucasian female   G1P0 with Patient's last menstrual period was 06/04/2012.   here for: 6 week med f/u     GYNECOLOGIC HISTORY: Patient's last menstrual period was 06/04/2012. Contraception:  PMP Menopausal hormone therapy:  estrace Last 2 paps:  07/04/23 neg: HR HPV neg, 07/05/19 neg History of abnormal Pap or positive HPV:  no Mammogram:  12/02/22 Breast Density cat B, BI-RADS CAT 1 neg        OB History     Gravida  1   Para      Term      Preterm      AB      Living  1      SAB      IAB      Ectopic      Multiple      Live Births                 Patient Active Problem List   Diagnosis Date Noted   Family history of breast cancer    Family history of prostate cancer    Family history of pancreatic cancer    Sinusitis 09/05/2011   Lumbar paraspinal muscle spasm 09/05/2011   SHOULDER PAIN, LEFT 06/18/2009   TOE PAIN 06/18/2009   EXCESSIVE OR FREQUENT MENSTRUATION 03/20/2009   Backache 11/17/2008   FIBROIDS, UTERUS 04/18/2008   ABDOMINAL BLOATING 04/11/2008   Allergic rhinitis 10/31/2006    Past Medical History:  Diagnosis Date   Allergy    Arthritis    hips, shoulders   Constipation    Mag helpd- uses juice plus- has a daily BM's that soft    Family history of breast cancer    Family history of pancreatic cancer    Family history of prostate cancer     Past Surgical History:  Procedure Laterality Date   BILATERAL ANTERIOR TOTAL HIP ARTHROPLASTY  12/11  06/13   COLONOSCOPY     JOINT REPLACEMENT     second hip 2013   OTHER SURGICAL HISTORY     upper jaw sx   POLYPECTOMY     TOTAL HIP ARTHROPLASTY     bil.    Current Outpatient Medications  Medication Sig Dispense Refill   Ascorbic Acid (VITAMIN C) 1000 MG tablet Take 1,000 mg by mouth daily.     cetirizine (ZYRTEC) 10 MG tablet Take 1 tablet (10 mg total) by mouth daily. 90 tablet 1   estradiol (ESTRACE) 0.1 MG/GM vaginal cream Place  1/2 gram per vagina at hs 2 - 3 nights per week. 42.5 g 1   Magnesium 400 MG CAPS Take 2 capsules by mouth daily. Reported on 12/17/2015     Multiple Vitamin (MULTIVITAMIN) tablet Take 1 tablet by mouth daily.     Multiple Vitamins-Minerals (MAXIMUM DAILY GREEN PO) Take by mouth daily.     Omega-3 Fatty Acids (OMEGA 3 PO) Take 2 capsules by mouth daily.     sertraline (ZOLOFT) 50 MG tablet Take 1 tablet (50 mg total) by mouth daily. 30 tablet 1   UNABLE TO FIND Med Name: Woody Seller Thryoid Supplement and Adrenal Supplement     No current facility-administered medications for this visit.     ALLERGIES: Butorphanol tartrate, Gluten meal, Lactose, and Other  Family History  Problem Relation Age of Onset   Hypertension Mother    Hyperlipidemia Mother    Glaucoma Mother    Breast cancer  Mother 78   Hypertension Father    Hyperlipidemia Father    Diabetes Father        secondary to pancreatic ca   Pancreatic cancer Father 39       pancreatic can   Heart disease Father        stent   Prostate cancer Father 67   Breast cancer Maternal Aunt 69   Heart disease Paternal Uncle    Lung cancer Paternal Grandmother        smoker   Heart disease Paternal Grandfather        MI   Colon cancer Neg Hx    Colon polyps Neg Hx    Esophageal cancer Neg Hx    Rectal cancer Neg Hx    Stomach cancer Neg Hx     Social History   Socioeconomic History   Marital status: Single    Spouse name: Not on file   Number of children: Not on file   Years of education: Not on file   Highest education level: Not on file  Occupational History    Comment: [employer]   Tobacco Use   Smoking status: Never   Smokeless tobacco: Never  Vaping Use   Vaping status: Never Used  Substance and Sexual Activity   Alcohol use: Yes    Alcohol/week: 3.0 standard drinks of alcohol    Types: 3 Glasses of wine per week    Comment: Red wine   Drug use: No   Sexual activity: Yes    Partners: Male    Birth  control/protection: Post-menopausal  Other Topics Concern   Not on file  Social History Narrative   Not on file   Social Drivers of Health   Financial Resource Strain: Not on file  Food Insecurity: Not on file  Transportation Needs: Not on file  Physical Activity: Not on file  Stress: Not on file  Social Connections: Not on file  Intimate Partner Violence: Not on file    Review of Systems  PHYSICAL EXAMINATION:   LMP 06/04/2012     General appearance: alert, cooperative and appears stated age Head: Normocephalic, without obvious abnormality, atraumatic Neck: no adenopathy, supple, symmetrical, trachea midline and thyroid normal to inspection and palpation Lungs: clear to auscultation bilaterally Breasts: normal appearance, no masses or tenderness, No nipple retraction or dimpling, No nipple discharge or bleeding, No axillary or supraclavicular adenopathy Heart: regular rate and rhythm Abdomen: soft, non-tender, no masses,  no organomegaly Extremities: extremities normal, atraumatic, no cyanosis or edema Skin: Skin color, texture, turgor normal. No rashes or lesions Lymph nodes: Cervical, supraclavicular, and axillary nodes normal. No abnormal inguinal nodes palpated Neurologic: Grossly normal  Pelvic: External genitalia:  no lesions              Urethra:  normal appearing urethra with no masses, tenderness or lesions              Bartholins and Skenes: normal                 Vagina: normal appearing vagina with normal color and discharge, no lesions              Cervix: no lesions                Bimanual Exam:  Uterus:  normal size, contour, position, consistency, mobility, non-tender              Adnexa: no mass, fullness, tenderness  Rectal exam: {yes no:314532}.  Confirms.              Anus:  normal sphincter tone, no lesions  Chaperone was present for exam:  {BSCHAPERONE:31226::"Barbi Kumagai F, CMA"}  ASSESSMENT:    PLAN:    {LABS  (Optional):23779}  ***  total time was spent for this patient encounter, including preparation, face-to-face counseling with the patient, coordination of care, and documentation of the encounter.

## 2023-09-21 ENCOUNTER — Telehealth: Payer: Self-pay | Admitting: Genetic Counselor

## 2023-09-21 ENCOUNTER — Ambulatory Visit: Payer: Self-pay | Admitting: Genetic Counselor

## 2023-09-21 ENCOUNTER — Encounter: Payer: Self-pay | Admitting: Genetic Counselor

## 2023-09-21 DIAGNOSIS — Z1379 Encounter for other screening for genetic and chromosomal anomalies: Secondary | ICD-10-CM

## 2023-09-21 NOTE — Telephone Encounter (Signed)
LM on VM that results are back.  Left CB instructions.

## 2023-09-21 NOTE — Telephone Encounter (Signed)
Revealed negative genetic testing.  Discussed that we do not know why there is cancer in the family. It could be due to a different gene that we are not testing, or maybe our current technology may not be able to pick something up.  It will be important for her to keep in contact with genetics to keep up with whether additional testing may be needed.  

## 2023-09-21 NOTE — Progress Notes (Signed)
HPI:  Ms. Khurana was previously seen in the [facility] due to a family history of cancer and concerns regarding a hereditary predisposition to cancer. Please refer to our prior cancer genetics clinic note for more information regarding our discussion, assessment and recommendations, at the time. Ms. Portier recent genetic test results were disclosed to her, as were recommendations warranted by these results. These results and recommendations are discussed in more detail below.  CANCER HISTORY:  Oncology History   No history exists.    FAMILY HISTORY:  We obtained a detailed, 4-generation family history.  Significant diagnoses are listed below: Family History  Problem Relation Age of Onset   Hypertension Mother    Hyperlipidemia Mother    Glaucoma Mother    Breast cancer Mother 22   Hypertension Father    Hyperlipidemia Father    Diabetes Father        secondary to pancreatic ca   Pancreatic cancer Father 63       pancreatic can   Heart disease Father        stent   Prostate cancer Father 80   Breast cancer Maternal Aunt 69   Heart disease Paternal Uncle    Lung cancer Paternal Grandmother        smoker   Heart disease Paternal Grandfather        MI   Colon cancer Neg Hx    Colon polyps Neg Hx    Esophageal cancer Neg Hx    Rectal cancer Neg Hx    Stomach cancer Neg Hx        The patient has one daughter who is cancer free.  She has one brother who is cancer free.  Her father is deceased and her mother is living.    The patient's mother was diagnosed with breast cancer at 15.  She has two sisters, one who had breast cancer at 34.  There is no other reported family history of cancer.   The patient's father developed pancreatic cancer at 21 and prostate cancer at 52.  He had one brother who died at 33 from heart disease.  His mother had lung cancer.   Ms. Arceneaux is unaware of previous family history of genetic testing for hereditary cancer risks.  There is no reported Ashkenazi Jewish ancestry. There is no known consanguinity.  GENETIC TEST RESULTS: Genetic testing reported out on September 08, 2023 through the CancerNext-Expanded+RNAinsight cancer panel found no pathogenic mutations. The CancerNext-Expanded gene panel offered by [facility] and includes sequencing, rearrangement, and RNA analysis for the following 76 genes: AIP, ALK, APC, ATM, AXIN2, BAP1, BARD1, BMPR1A, BRCA1, BRCA2, BRIP1, CDC73, CDH1, CDK4, CDKN1B, CDKN2A, CEBPA, CHEK2, CTNNA1, DDX41, DICER1, ETV6, FH, FLCN, GATA2, LZTR1, MAX, MBD4, MEN1, MET, MLH1, MSH2, MSH3, MSH6, MUTYH, NF1, NF2, NTHL1, PALB2, PHOX2B, PMS2, POT1, PRKAR1A, PTCH1, PTEN, RAD51C, RAD51D, RB1, RET, RUNX1, SDHA, SDHAF2, SDHB, SDHC, SDHD, SMAD4, SMARCA4, SMARCB1, SMARCE1, STK11, SUFU, TMEM127, TP53, TSC1, TSC2, VHL, and WT1 (sequencing and deletion/duplication); EGFR, HOXB13, KIT, MITF, PDGFRA, POLD1, and POLE (sequencing only); EPCAM and GREM1 (deletion/duplication only). The test report has been scanned into EPIC and is located under the Molecular Pathology section of the Results Review tab.  A portion of the result report is included below for reference.     We discussed with Ms. Middlebrooks that because current genetic testing is not perfect, it is possible there may be a gene mutation in one of these genes that current testing cannot detect, but  that chance is small.  We also discussed, that there could be another gene that has not yet been discovered, or that we have not yet tested, that is responsible for the cancer diagnoses in the family. It is also possible there is a hereditary cause for the cancer in the family that Ms. Sutherland did not inherit and therefore was not identified in her testing.  Therefore, it is important to remain in touch with cancer genetics in the future so that we can continue to offer Ms. Tobin the most up to date genetic testing.   ADDITIONAL GENETIC TESTING: We discussed with Ms.  Pha that her genetic testing was fairly extensive.  If there are genes identified to increase cancer risk that can be analyzed in the future, we would be happy to discuss and coordinate this testing at that time.    CANCER SCREENING RECOMMENDATIONS: Ms. Handrich test result is considered negative (normal).  This means that we have not identified a hereditary cause for her family history of cancer at this time. Most cancers happen by chance and this negative test suggests that her family history of cancer may fall into this category.    Possible reasons for Ms. Dorrough's negative genetic test include:  1. There may be a gene mutation in one of these genes that current testing methods cannot detect but that chance is small.  2. There could be another gene that has not yet been discovered, or that we have not yet tested, that is responsible for the cancer diagnoses in the family.  3.  There may be no hereditary risk for cancer in the family. The cancers in Ms. Faxon and/or her family may be sporadic/familial or due to other genetic and environmental factors. 4. It is also possible there is a hereditary cause for the cancer in the family that Ms. Constancio did not inherit.  Therefore, it is recommended she continue to follow the cancer management and screening guidelines provided by her primary healthcare provider. An individual's cancer risk and medical management are not determined by genetic test results alone. Overall cancer risk assessment incorporates additional factors, including personal medical history, family history, and any available genetic information that may result in a personalized plan for cancer prevention and surveillance  RECOMMENDATIONS FOR FAMILY MEMBERS:   Since she did not inherit a identifiable mutation in a cancer predisposition gene included on this panel, her children could not have inherited a known mutation from her in one of these genes. Individuals in this family might  be at some increased risk of developing cancer, over the general population risk, simply due to the family history of cancer.  We recommended women in this family have a yearly mammogram beginning at age 50, or 56 years younger than the earliest onset of cancer, an annual clinical breast exam, and perform monthly breast self-exams. Women in this family should also have a gynecological exam as recommended by their primary provider. All family members should be referred for colonoscopy starting at age 34, or 24 years younger than the earliest onset of cancer. It is also possible there is a hereditary cause for the cancer in Ms. Belgarde's family that she did not inherit and therefore was not identified in her.  Based on Ms. Negrete's family history, we recommended her brother have genetic counseling and testing. Ms. Glasscock will let us know if we can be of any assistance in coordinating genetic counseling and/or testing for this family member.   FOLLOW-UP: Lastly, we discussed  with Ms. Stacks that cancer genetics is a rapidly advancing field and it is possible that new genetic tests will be appropriate for her and/or her family members in the future. We encouraged her to remain in contact with cancer genetics on an annual basis so we can update her personal and family histories and let her know of advances in cancer genetics that may benefit this family.   Our contact number was provided. Ms. Arnall questions were answered to her satisfaction, and she knows she is welcome to call us at anytime with additional questions or concerns.   Maylon Cos, MS, [facility] Licensed, Certified Genetic Counselor [email or web address]

## 2023-09-25 ENCOUNTER — Ambulatory Visit: Payer: Managed Care, Other (non HMO) | Admitting: Obstetrics and Gynecology

## 2023-12-27 LAB — HM MAMMOGRAPHY

## 2023-12-28 ENCOUNTER — Ambulatory Visit: Payer: Self-pay | Admitting: Obstetrics and Gynecology

## 2024-01-04 LAB — HM DEXA SCAN

## 2024-01-09 ENCOUNTER — Encounter: Payer: Self-pay | Admitting: Obstetrics and Gynecology

## 2024-01-09 ENCOUNTER — Ambulatory Visit: Payer: Self-pay | Admitting: Obstetrics and Gynecology

## 2024-02-09 ENCOUNTER — Telehealth: Payer: Self-pay

## 2024-02-09 NOTE — Telephone Encounter (Signed)
 Telephone call from [place], from patients PCP. They requested patient last pap to be faxed to them.   I faxed the report.

## 2024-05-29 ENCOUNTER — Other Ambulatory Visit: Payer: Self-pay | Admitting: Medical Genetics

## 2024-05-29 DIAGNOSIS — Z006 Encounter for examination for normal comparison and control in clinical research program: Secondary | ICD-10-CM

## 2024-07-03 NOTE — Progress Notes (Deleted)
 63 y.o. G1P0 Single Caucasian female here for annual exam.    PCP: Burney Darice CROME, MD   Patient's last menstrual period was 06/04/2012.           Sexually active: Yes.    The current method of family planning is post menopausal status.    Menopausal hormone therapy:  Estrace   Exercising: {yes no:314532}  {types:19826} Smoker:  no  OB History  Gravida Para Term Preterm AB Living  1     1  SAB IAB Ectopic Multiple Live Births          # Outcome Date GA Lbr Len/2nd Weight Sex Type Anes PTL Lv  1 Gravida              HEALTH MAINTENANCE: Last 2 paps:  07/04/23 neg HR HPV neg, 07/05/19 neg  History of abnormal Pap or positive HPV:  no Mammogram:   12/27/23 Breast Density Cat B, BIRADS Cat 1 neg  Colonoscopy:  04/06/18  Bone Density:  01/07/24  Result  osteopenia    Immunization History  Administered Date(s) Administered   Influenza Whole 06/18/2009, 05/01/2012   Influenza,inj,Quad PF,6+ Mos 05/10/2019   Tdap 08/14/2012   Zoster Recombinant(Shingrix ) 12/19/2016, 11/07/2017      reports that she has never smoked. She has never used smokeless tobacco. She reports current alcohol use of about 3.0 standard drinks of alcohol per week. She reports that she does not use drugs.  Past Medical History:  Diagnosis Date   Allergy    Arthritis    hips, shoulders   Constipation    Mag helpd- uses juice plus- has a daily BM's that soft    Family history of breast cancer    Family history of pancreatic cancer    Family history of prostate cancer    Osteopenia 2025   left wrist    Past Surgical History:  Procedure Laterality Date   BILATERAL ANTERIOR TOTAL HIP ARTHROPLASTY  12/11  06/13   COLONOSCOPY     JOINT REPLACEMENT     second hip 2013   OTHER SURGICAL HISTORY     upper jaw sx   POLYPECTOMY     TOTAL HIP ARTHROPLASTY     bil.    Current Outpatient Medications  Medication Sig Dispense Refill   Ascorbic Acid (VITAMIN C) 1000 MG tablet Take 1,000 mg by mouth daily.      cetirizine  (ZYRTEC ) 10 MG tablet Take 1 tablet (10 mg total) by mouth daily. 90 tablet 1   estradiol  (ESTRACE ) 0.1 MG/GM vaginal cream Place 1/2 gram per vagina at hs 2 - 3 nights per week. 42.5 g 1   Magnesium 400 MG CAPS Take 2 capsules by mouth daily. Reported on 12/17/2015     Multiple Vitamin (MULTIVITAMIN) tablet Take 1 tablet by mouth daily.     Multiple Vitamins-Minerals (MAXIMUM DAILY GREEN PO) Take by mouth daily.     Omega-3 Fatty Acids (OMEGA 3 PO) Take 2 capsules by mouth daily.     sertraline  (ZOLOFT ) 50 MG tablet Take 1 tablet (50 mg total) by mouth daily. 30 tablet 1   UNABLE TO FIND Med Name: Gaia Thryoid Supplement and Adrenal Supplement     No current facility-administered medications for this visit.    ALLERGIES: Butorphanol tartrate, Gluten meal, Lactose, and Other  Family History  Problem Relation Age of Onset   Hypertension Mother    Hyperlipidemia Mother    Glaucoma Mother    Breast cancer Mother 21  Hypertension Father    Hyperlipidemia Father    Diabetes Father        secondary to pancreatic ca   Pancreatic cancer Father 46       pancreatic can   Heart disease Father        stent   Prostate cancer Father 43   Breast cancer Maternal Aunt 69   Heart disease Paternal Uncle    Lung cancer Paternal Grandmother        smoker   Heart disease Paternal Grandfather        MI   Colon cancer Neg Hx    Colon polyps Neg Hx    Esophageal cancer Neg Hx    Rectal cancer Neg Hx    Stomach cancer Neg Hx     Review of Systems  PHYSICAL EXAM:  LMP 06/04/2012     General appearance: alert, cooperative and appears stated age Head: normocephalic, without obvious abnormality, atraumatic Neck: no adenopathy, supple, symmetrical, trachea midline and thyroid  normal to inspection and palpation Lungs: clear to auscultation bilaterally Breasts: normal appearance, no masses or tenderness, No nipple retraction or dimpling, No nipple discharge or bleeding, No axillary  adenopathy Heart: regular rate and rhythm Abdomen: soft, non-tender; no masses, no organomegaly Extremities: extremities normal, atraumatic, no cyanosis or edema Skin: skin color, texture, turgor normal. No rashes or lesions Lymph nodes: cervical, supraclavicular, and axillary nodes normal. Neurologic: grossly normal  Pelvic: External genitalia:  no lesions              No abnormal inguinal nodes palpated.              Urethra:  normal appearing urethra with no masses, tenderness or lesions              Bartholins and Skenes: normal                 Vagina: normal appearing vagina with normal color and discharge, no lesions              Cervix: no lesions              Pap taken: {yes no:314532} Bimanual Exam:  Uterus:  normal size, contour, position, consistency, mobility, non-tender              Adnexa: no mass, fullness, tenderness              Rectal exam: {yes no:314532}.  Confirms.              Anus:  normal sphincter tone, no lesions  Chaperone was present for exam:  {BSCHAPERONE:31226::Emily F, CMA}  ASSESSMENT: Well woman visit with gynecologic exam.  PHQ-2-9: ***  ***  PLAN: Mammogram screening discussed. Self breast awareness reviewed. Pap and HRV collected:  {yes no:314532} Guidelines for Calcium, Vitamin D , regular exercise program including cardiovascular and weight bearing exercise. Medication refills:  *** {LABS (Optional):23779} Follow up:  ***    Additional counseling given.  {yes [id number]. ***  total time was spent for this patient encounter, including preparation, face-to-face counseling with the patient, coordination of care, and documentation of the encounter in addition to doing the well woman visit with gynecologic exam.

## 2024-07-04 ENCOUNTER — Ambulatory Visit: Payer: Managed Care, Other (non HMO) | Admitting: Obstetrics and Gynecology

## 2024-07-10 ENCOUNTER — Ambulatory Visit: Admitting: Obstetrics and Gynecology

## 2024-07-10 ENCOUNTER — Encounter: Payer: Self-pay | Admitting: Obstetrics and Gynecology

## 2024-07-10 VITALS — BP 126/84 | HR 84 | Ht 72.5 in | Wt 176.0 lb

## 2024-07-10 DIAGNOSIS — Z1331 Encounter for screening for depression: Secondary | ICD-10-CM

## 2024-07-10 DIAGNOSIS — Z01419 Encounter for gynecological examination (general) (routine) without abnormal findings: Secondary | ICD-10-CM | POA: Diagnosis not present

## 2024-07-10 MED ORDER — ESTRADIOL 0.01 % VA CREA: TOPICAL_CREAM | VAGINAL | 2 refills | Status: AC

## 2024-07-10 NOTE — Progress Notes (Signed)
 63 y.o. G1P0 Single Caucasian female here for annual exam. Restarted taking compounded estradiol  & testosterone , and progesterone  with Dr. Burney about 2 months ago.  Feeling better on HRT.   May do a shoulder replacement.   Is starting mental health therapy.   Did not like Zoloft .  It disrupted her sleep.  New job.  Father was ill and has passed away.   PCP: Burney Darice CROME, MD   Patient's last menstrual period was 06/04/2012.           Sexually active: No. The current method of family planning is post menopausal status.    Menopausal hormone therapy:  Estrace , Compounded estradiol  & testosterone , and progesterone   Exercising: Yes.    Walking, yoga, and pilates  Smoker:  no  OB History  Gravida Para Term Preterm AB Living  1     1  SAB IAB Ectopic Multiple Live Births          # Outcome Date GA Lbr Len/2nd Weight Sex Type Anes PTL Lv  1 Gravida              HEALTH MAINTENANCE: Last 2 paps:  07/04/23 neg HR HPV neg, 07/05/19 neg  History of abnormal Pap or positive HPV:  remote hx of abnormal pap.  No treatment for abnormal pap.  Mammogram:   12/27/23 Breast Density Cat B, BIRADS Cat 1 neg  Colonoscopy:  04/06/18 Bone Density:  01/04/24  Result  osteopenia of left wrist.  Spine is normal.   Immunization History  Administered Date(s) Administered   Influenza Whole 06/18/2009, 05/01/2012   Influenza,inj,Quad PF,6+ Mos 05/10/2019   Tdap 08/14/2012   Zoster Recombinant(Shingrix ) 12/19/2016, 11/07/2017      reports that she has never smoked. She has never used smokeless tobacco. She reports current alcohol use of about 3.0 standard drinks of alcohol per week. She reports that she does not use drugs.  Past Medical History:  Diagnosis Date   Allergy    Arthritis    hips, shoulders   Constipation    Mag helpd- uses juice plus- has a daily BM's that soft    Family history of breast cancer    Family history of pancreatic cancer    Family history of prostate cancer     Osteopenia 2025   left wrist    Past Surgical History:  Procedure Laterality Date   BILATERAL ANTERIOR TOTAL HIP ARTHROPLASTY  12/11  06/13   COLONOSCOPY     JOINT REPLACEMENT     second hip 2013   OTHER SURGICAL HISTORY     upper jaw sx   POLYPECTOMY     TOTAL HIP ARTHROPLASTY     bil.    Current Outpatient Medications  Medication Sig Dispense Refill   Ascorbic Acid (VITAMIN C) 1000 MG tablet Take 1,000 mg by mouth daily.     cetirizine  (ZYRTEC ) 10 MG tablet Take 1 tablet (10 mg total) by mouth daily. 90 tablet 1   estradiol  (ESTRACE ) 0.01 % CREA vaginal cream Use 1/2 g vaginally two or three times per week as needed to maintain symptom relief. 42.5 g 2   Magnesium 400 MG CAPS Take 2 capsules by mouth daily. Reported on 12/17/2015     Multiple Vitamin (MULTIVITAMIN) tablet Take 1 tablet by mouth daily.     Multiple Vitamins-Minerals (MAXIMUM DAILY GREEN PO) Take by mouth daily.     NONFORMULARY OR COMPOUNDED ITEM Compounded estradiol  & testosterone      Omega-3 Fatty Acids (OMEGA  3 PO) Take 2 capsules by mouth daily.     progesterone  (PROMETRIUM ) 100 MG capsule TAKE ONE CAPSULE ORALLY AT BEDTIME FOR 30 DAYS; Duration: 30     UNABLE TO FIND Med Name: Gaia Thryoid Supplement and Adrenal Supplement     No current facility-administered medications for this visit.    ALLERGIES: Butorphanol tartrate, Gluten meal, Lactose, and Other  Family History  Problem Relation Age of Onset   Hypertension Mother    Hyperlipidemia Mother    Glaucoma Mother    Breast cancer Mother 79   Hypertension Father    Hyperlipidemia Father    Diabetes Father        secondary to pancreatic ca   Pancreatic cancer Father 18       pancreatic can   Heart disease Father        stent   Prostate cancer Father 32   Breast cancer Maternal Aunt 69   Heart disease Paternal Uncle    Lung cancer Paternal Grandmother        smoker   Heart disease Paternal Grandfather        MI   Colon cancer Neg Hx     Colon polyps Neg Hx    Esophageal cancer Neg Hx    Rectal cancer Neg Hx    Stomach cancer Neg Hx     Review of Systems  All other systems reviewed and are negative.   PHYSICAL EXAM:  BP 126/84 (BP Location: Left Arm, Patient Position: Sitting)   Pulse 84   Ht 6' 0.5 (1.842 m)   Wt 176 lb (79.8 kg)   LMP 06/04/2012   SpO2 97%   BMI 23.54 kg/m     General appearance: alert, cooperative and appears stated age Head: normocephalic, without obvious abnormality, atraumatic Neck: no adenopathy, supple, symmetrical, trachea midline and thyroid  normal to inspection and palpation Lungs: clear to auscultation bilaterally Breasts: normal appearance, no masses or tenderness, No nipple retraction or dimpling, No nipple discharge or bleeding, No axillary adenopathy Heart: regular rate and rhythm Abdomen: soft, non-tender; no masses, no organomegaly Extremities: extremities normal, atraumatic, no cyanosis or edema Skin: skin color, texture, turgor normal. No rashes or lesions Lymph nodes: cervical, supraclavicular, and axillary nodes normal. Neurologic: grossly normal  Pelvic: External genitalia:  no lesions              No abnormal inguinal nodes palpated.              Urethra:  normal appearing urethra with no masses, tenderness or lesions              Bartholins and Skenes: normal                 Vagina: normal appearing vagina with normal color and discharge, no lesions              Cervix: no lesions              Pap taken: no Bimanual Exam:  Uterus:  normal size, contour, position, consistency, mobility, non-tender              Adnexa: no mass, fullness, tenderness              Rectal exam: yes.  Confirms.              Anus:  normal sphincter tone, no lesions  Chaperone was present for exam:  Darice BROCKS, CMA  ASSESSMENT: Well woman visit with gynecologic exam. Menopausal  symptoms.  FH breast and pancreatic cancer. Mother and maternal aunt with breast cancer. Personal breast  cancer risk 13.9%. Negative personal genetic testing.  Osteopenia.  PHQ-2-9: 0  PLAN: Mammogram screening discussed. Self breast awareness reviewed. Pap and HRV collected:  no.  Due in 2029.  Guidelines for Calcium, Vitamin D , regular exercise program including cardiovascular and weight bearing exercise. Medication refills:  estradiol  vaginal cream.   Labs with PCP.  BMD in 2027 at [facility].  Condolences for the loss patient's father.  Follow up:  yearly and prn.

## 2024-07-10 NOTE — Patient Instructions (Signed)

## 2025-07-15 ENCOUNTER — Ambulatory Visit: Admitting: Obstetrics and Gynecology
